# Patient Record
Sex: Male | Born: 1965 | Race: White | Hispanic: No | Marital: Married | Smoking: Never smoker
Health system: Southern US, Community
[De-identification: ages and names within clinical notes are randomized; demographics above are authoritative.]

## PROBLEM LIST (undated history)

## (undated) DIAGNOSIS — K838 Other specified diseases of biliary tract: Secondary | ICD-10-CM

## (undated) DIAGNOSIS — K9189 Other postprocedural complications and disorders of digestive system: Secondary | ICD-10-CM

## (undated) DIAGNOSIS — N400 Enlarged prostate without lower urinary tract symptoms: Secondary | ICD-10-CM

## (undated) DIAGNOSIS — Z8489 Family history of other specified conditions: Secondary | ICD-10-CM

## (undated) DIAGNOSIS — R3911 Hesitancy of micturition: Secondary | ICD-10-CM

---

## 2001-07-13 ENCOUNTER — Encounter: Payer: Self-pay | Admitting: Internal Medicine

## 2001-07-13 ENCOUNTER — Ambulatory Visit (HOSPITAL_COMMUNITY): Admission: RE | Admit: 2001-07-13 | Discharge: 2001-07-13 | Payer: Self-pay | Admitting: Internal Medicine

## 2001-09-21 ENCOUNTER — Encounter: Admission: RE | Admit: 2001-09-21 | Discharge: 2001-09-21 | Payer: Self-pay | Admitting: Gastroenterology

## 2001-09-21 ENCOUNTER — Encounter: Payer: Self-pay | Admitting: Gastroenterology

## 2009-06-20 DIAGNOSIS — K838 Other specified diseases of biliary tract: Secondary | ICD-10-CM

## 2009-06-20 HISTORY — PX: ERCP W/ METAL STENT PLACEMENT: SHX1521

## 2009-06-20 HISTORY — DX: Other specified diseases of biliary tract: K83.8

## 2010-03-04 HISTORY — PX: CHOLECYSTECTOMY: SHX55

## 2014-09-25 ENCOUNTER — Emergency Department (HOSPITAL_BASED_OUTPATIENT_CLINIC_OR_DEPARTMENT_OTHER)
Admission: EM | Admit: 2014-09-25 | Discharge: 2014-09-25 | Disposition: A | Discharge status: Home / Self Care | Payer: Worker's Compensation | Attending: Emergency Medicine | Admitting: Emergency Medicine

## 2014-09-25 ENCOUNTER — Encounter (HOSPITAL_BASED_OUTPATIENT_CLINIC_OR_DEPARTMENT_OTHER): Payer: Self-pay | Admitting: *Deleted

## 2014-09-25 DIAGNOSIS — K429 Umbilical hernia without obstruction or gangrene: Secondary | ICD-10-CM | POA: Insufficient documentation

## 2014-09-25 DIAGNOSIS — R1033 Periumbilical pain: Secondary | ICD-10-CM | POA: Diagnosis present

## 2014-09-25 NOTE — Discharge Instructions (Signed)
Hernia °A hernia happens when an organ inside your body pushes out through a weak spot in your belly (abdominal) wall. Most hernias get worse over time. They can often be pushed back into place (reduced). Surgery may be needed to repair hernias that cannot be pushed into place. °HOME CARE °· Keep doing normal activities. °· Avoid lifting more than 10 pounds (4.5 kilograms). °· Cough gently and avoid straining. Over time, these things will: °¨ Increase your hernia size. °¨ Irritate your hernia. °¨ Break down hernia repairs. °· Stop smoking. °· Do not wear anything tight over your hernia. Do not keep the hernia in with an outside bandage. °· Eat food that is high in fiber (fruit, vegetables, whole grains). °· Drink enough fluids to keep your pee (urine) clear or pale yellow. °· Take medicines to make your poop soft (stool softeners) if you cannot poop (constipated). °GET HELP RIGHT AWAY IF:  °· You have a fever. °· You have belly pain that gets worse. °· You feel sick to your stomach (nauseous) and throw up (vomit). °· Your skin starts to bulge out. °· Your hernia turns a different color, feels hard, or is tender. °· You have increased pain or puffiness (swelling) around the hernia. °· You poop more or less often. °· Your poop does not look the way normally does. °· You have watery poop (diarrhea). °· You cannot push the hernia back in place by applying gentle pressure while lying down. °MAKE SURE YOU:  °· Understand these instructions. °· Will watch your condition. °· Will get help right away if you are not doing well or get worse. °Document Released: 11/24/2009 Document Revised: 08/29/2011 Document Reviewed: 11/24/2009 °ExitCare® Patient Information ©2015 [employer]. This information is not intended to replace advice given to you by your health care provider. Make sure you discuss any questions you have with your health care provider. ° °

## 2014-09-25 NOTE — ED Notes (Signed)
Was picking up a 150 lb object and felt a pain in his abdomen. Possible umbellical hernia.

## 2014-09-25 NOTE — ED Provider Notes (Signed)
CSN: [id number]     Arrival date & time 09/25/14  1545 History   First MD Initiated Contact with Patient 09/25/14 1650     Chief Complaint  Patient presents with  . Abdominal Pain      HPI Patient was doing a simulated rescue and was lifting 150 pound object when he developed a protrusion in his umbilicus.  He pushed on it for a while and then it is gone now.  Patient has no significant pain.  No vomiting or diarrhea. History reviewed. No pertinent past medical history. Past Surgical History  Procedure Laterality Date  . Cholecystectomy     No family history on file. History  Substance Use Topics  . Smoking status: Never Smoker   . Smokeless tobacco: Not on file  . Alcohol Use: No    Review of Systems  All other systems reviewed and are negative  Allergies  Ciprofloxacin hcl  Home Medications   Prior to Admission medications   Not on File   BP 128/81 mmHg  Pulse 76  Temp(Src) 98.3 F (36.8 C) (Oral)  Resp 20  Ht 5\' 11"  (1.803 m)  Wt 185 lb (83.915 kg)  BMI 25.81 kg/m2  SpO2 100% Physical Exam  Constitutional: He is oriented to person, place, and time. He appears well-developed and well-nourished. No distress.  HENT:  Head: Normocephalic and atraumatic.  Eyes: Pupils are equal, round, and reactive to light.  Neck: Normal range of motion.  Cardiovascular: Normal rate and intact distal pulses.   Pulmonary/Chest: No respiratory distress.  Abdominal: Normal appearance. He exhibits no distension.    Musculoskeletal: Normal range of motion.  Neurological: He is alert and oriented to person, place, and time. No cranial nerve deficit.  Skin: Skin is warm and dry. No rash noted.  Psychiatric: He has a normal mood and affect. His behavior is normal.  Nursing note and vitals reviewed.   ED Course  Procedures (including critical care time) Labs Review Labs Reviewed - No data to display  Imaging Review No results found.    MDM   Final diagnoses:  Umbilical  hernia, recurrence not specified        Nelva Nayobert Auston Halfmann, MD 09/25/14 1723

## 2014-10-14 ENCOUNTER — Other Ambulatory Visit: Payer: Self-pay | Admitting: General Surgery

## 2014-12-19 ENCOUNTER — Encounter (HOSPITAL_COMMUNITY)
Admission: RE | Admit: 2014-12-19 | Discharge: 2014-12-19 | Disposition: A | Discharge status: Home / Self Care | Payer: Worker's Compensation | Source: Ambulatory Visit | Attending: General Surgery | Admitting: General Surgery

## 2014-12-19 ENCOUNTER — Encounter (HOSPITAL_COMMUNITY): Payer: Self-pay

## 2014-12-19 DIAGNOSIS — Z01812 Encounter for preprocedural laboratory examination: Secondary | ICD-10-CM | POA: Diagnosis not present

## 2014-12-19 DIAGNOSIS — K432 Incisional hernia without obstruction or gangrene: Secondary | ICD-10-CM | POA: Diagnosis not present

## 2014-12-19 HISTORY — DX: Family history of other specified conditions: Z84.89

## 2014-12-19 HISTORY — DX: Hesitancy of micturition: R39.11

## 2014-12-19 LAB — BASIC METABOLIC PANEL
ANION GAP: 7 (ref 5–15)
BUN: 9 mg/dL (ref 6–20)
CO2: 28 mmol/L (ref 22–32)
CREATININE: 1.21 mg/dL (ref 0.61–1.24)
Calcium: 9.1 mg/dL (ref 8.9–10.3)
Chloride: 103 mmol/L (ref 101–111)
GFR calc Af Amer: >60 mL/min (ref >60)
Glucose, Bld: 105 mg/dL — ABNORMAL HIGH (ref 65–99)
POTASSIUM: 4.5 mmol/L (ref 3.5–5.1)
Sodium: 138 mmol/L (ref 135–145)

## 2014-12-19 LAB — CBC WITH DIFFERENTIAL/PLATELET
Basophils Absolute: 0 10*3/uL (ref 0.0–0.1)
Basophils Relative: 1 % (ref 0–1)
EOS ABS: 0.1 10*3/uL (ref 0.0–0.7)
EOS PCT: 1 % (ref 0–5)
HCT: 45.6 % (ref 39.0–52.0)
Hemoglobin: 15.1 g/dL (ref 13.0–17.0)
LYMPHS PCT: 28 % (ref 12–46)
Lymphs Abs: 1.7 10*3/uL (ref 0.7–4.0)
MCH: 28 pg (ref 26.0–34.0)
MCHC: 33.1 g/dL (ref 30.0–36.0)
MCV: 84.4 fL (ref 78.0–100.0)
Monocytes Absolute: 0.5 10*3/uL (ref 0.1–1.0)
Monocytes Relative: 7 % (ref 3–12)
Neutro Abs: 4 10*3/uL (ref 1.7–7.7)
Neutrophils Relative %: 63 % (ref 43–77)
Platelets: 248 10*3/uL (ref 150–400)
RBC: 5.4 MIL/uL (ref 4.22–5.81)
RDW: 13 % (ref 11.5–15.5)
WBC: 6.2 10*3/uL (ref 4.0–10.5)

## 2014-12-19 LAB — NO BLOOD PRODUCTS

## 2014-12-19 NOTE — Patient Instructions (Signed)
Karenann Caiimothy R Nakatani  12/19/2014   Your procedure is scheduled on: Wednesday December 24, 2014   [facility]  Entrance take [facility]  elevators to 3rd floor to  Short Stay Center at 6:30 AM.  Call this number if you have problems the morning of surgery [phone]   Remember: ONLY 1 PERSON MAY GO WITH YOU TO SHORT STAY TO GET  READY MORNING OF YOUR SURGERY.  Do not eat food or drink liquids :After Midnight.     Take these medicines the morning of surgery with A SIP OF WATER: Loratadine (Claritin) if needed                                You may not have any metal on your body including hair pins and              piercings  Do not wear jewelry, lotions, powders colognes, deodorant                          Men may shave face and neck.   Do not bring valuables to the hospital. [facility] IS NOT             RESPONSIBLE   FOR VALUABLES.  Contacts, dentures or bridgework may not be worn into surgery.  Leave suitcase in the car. After surgery it may be brought to your room.     _____________________________________________________________________             [facility] - Preparing for [facility] Before surgery, you can play an important role.  Because skin is not sterile, your skin needs to be as free of germs as possible.  You can reduce the number of germs on your skin by washing with CHG (chlorahexidine gluconate) soap before surgery.  CHG is an antiseptic cleaner which kills germs and bonds with the skin to continue killing germs even after washing. Please DO NOT use if you have an allergy to CHG or antibacterial soaps.  If your skin becomes reddened/irritated stop using the CHG and inform your nurse when you arrive at Short Stay. Do not shave (including legs and underarms) for at least 48 hours prior to the first CHG shower.  You may shave your face/neck. Please follow these instructions carefully:  1.  Shower with CHG Soap the night before surgery and the   morning of Surgery.  2.  If you choose to wash your hair, wash your hair first as usual with your  normal  shampoo.  3.  After you shampoo, rinse your hair and body thoroughly to remove the  shampoo.                           4.  Use CHG as you would any other liquid soap.  You can apply chg directly  to the skin and wash                       Gently with a scrungie or clean washcloth.  5.  Apply the CHG Soap to your body ONLY FROM THE NECK DOWN.   Do not use on face/ open  Wound or open sores. Avoid contact with eyes, ears mouth and genitals (private parts).                       Wash face,  Genitals (private parts) with your normal soap.             6.  Wash thoroughly, paying special attention to the area where your surgery  will be performed.  7.  Thoroughly rinse your body with warm water from the neck down.  8.  DO NOT shower/wash with your normal soap after using and rinsing off  the CHG Soap.                9.  Pat yourself dry with a clean towel.            10.  Wear clean pajamas.            11.  Place clean sheets on your bed the night of your first shower and do not  sleep with pets. Day of Surgery : Do not apply any lotions/deodorants the morning of surgery.  Please wear clean clothes to the hospital/surgery center.  FAILURE TO FOLLOW THESE INSTRUCTIONS MAY RESULT IN THE CANCELLATION OF YOUR SURGERY PATIENT SIGNATURE_________________________________  NURSE SIGNATURE__________________________________  ________________________________________________________________________

## 2014-12-19 NOTE — Progress Notes (Signed)
Blood Refusal Form faxed to [facility]  Contacted Dr Aura CampsIngrams office to notify of blood refusal but noting that pt agrees to albumin or albumin containing products spoke with Toniann FailWendy

## 2014-12-22 NOTE — H&P (Signed)
Harry Warren  Location: [facility] Patient #: [id number] DOB: 1966-01-02 Married / Language: English / Race: White Male        History of Present Illness   The patient is a 49 year old male who presents with an incisional hernia. This is a very pleasant 49 year old gentleman, here with his wife to talk about a periumbilical incisional hernia. He was evaluated by Nelva Nay in the ED.         . The patient has a history of a laparoscopic cholecystectomy in [facility] 4 years ago by Dr. Loreta Ave. He went home the same day but came back a day or 2 later with a cystic duct stump leak, bile leak. He had an ERCP with a stent and recovered but required a week of hospitalization. His wife said she used to do research with Dr. Loreta Ave. H       e has been doing well. He's recently had 3 or 4 episodes of a protrusion and pain at his umbilicus. This started when he was doing a simulated rescue and lifting a heavy object. He noticed a painful bulge. He pushed on it for a while and it went back in. Denies nausea vomiting or constipation or diarrhea. He is a healthy gentleman. Cholecystectomy is the only surgery he's had.         They are Jehov[employer]'s [employer]es. 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 He states that he wants to   have this repaired. I told him that I was going to treat this like an incisional hernia. We talked about open repair and laparoscopic repair. It was his preference to do the operation with the best durability and I felt that a laparoscopic repair with a larger piece of mesh would be the way to go, assuming we did not run into bad adhesions. He will be scheduled for a laparoscopic repair of incisional hernia with mesh, possible open repair. He is aware of the indications, details, techniques, and numerous risks of the surgery. He is aware the risk of bleeding, infection, conversion to open laparotomy, injury to adjacent organs such as  the intestine was major reconstructive surgery, mesh infection, recurrence of the hernia, and other unforeseen problems. He knows that he will be hospitalized for a couple of days in any event and that there will be no sports or heavy lifting for about 6 weeks. At this time all of his questions were answered. He agrees with this plan.   Other Problems  No pertinent past medical history  Past Surgical History  Gallbladder Surgery - Laparoscopic  Diagnostic Studies History  Colonoscopy never  Allergies  Ciprofloxacin *FLUOROQUINOLONES*  Medication History  No Current Medications  Social History  Alcohol use Occasional alcohol use. Caffeine use Coffee. No drug use Tobacco use Never smoker.  Review of Systems  General Not Present- Appetite Loss, Chills, Fatigue, Fever, Night Sweats, Weight Gain and Weight Loss. Skin Not Present- Change in Wart/Mole, Dryness, Hives, Jaundice, New Lesions, Non-Healing Wounds, Rash and Ulcer. HEENT Not Present- Earache, Hearing Loss, Hoarseness, Nose Bleed, Oral Ulcers, Ringing in the Ears, Seasonal Allergies, Sinus Pain, Sore Throat, Visual Disturbances, Wears glasses/contact lenses and Yellow Eyes. Respiratory Not Present- Bloody sputum, Chronic Cough, Difficulty Breathing, Snoring and Wheezing. Breast Not Present- Breast Mass, Breast Pain, Nipple Discharge and Skin Changes. Cardiovascular Not Present- Chest Pain, Difficulty Breathing Lying Down, Leg Cramps, Palpitations, Rapid Heart Rate, Shortness of Breath and Swelling of Extremities. Gastrointestinal Not Present-  Abdominal Pain, Bloating, Bloody Stool, Change in Bowel Habits, Chronic diarrhea, Constipation, Difficulty Swallowing, Excessive gas, Gets full quickly at meals, Hemorrhoids, Indigestion, Nausea, Rectal Pain and Vomiting. Male Genitourinary Not Present- Blood in Urine, Change in Urinary Stream, Frequency, Impotence, Nocturia, Painful Urination, Urgency and Urine  Leakage.   Vitals   Weight: 194.2 lb Height: 71in Body Surface Area: 2.1 m Body Mass Index: 27.09 kg/m Temp.: 98.49F(Oral)  Pulse: 72 (Regular)  BP: 104/66 (Sitting, Left Arm, Standard)    Physical Exam  General Mental Status-Alert. General Appearance-Consistent with stated age. Hydration-Well hydrated. Voice-Normal.  Head and Neck Head-normocephalic, atraumatic with no lesions or palpable masses. Trachea-midline. Thyroid Gland Characteristics - normal size and consistency.  Eye Eyeball - Bilateral-Extraocular movements intact. Sclera/Conjunctiva - Bilateral-No scleral icterus.  Chest and Lung Exam Chest and lung exam reveals -quiet, even and easy respiratory effort with no use of accessory muscles and on auscultation, normal breath sounds, no adventitious sounds and normal vocal resonance. Inspection Chest Wall - Normal. Back - normal.  Cardiovascular Cardiovascular examination reveals -normal heart sounds, regular rate and rhythm with no murmurs and normal pedal pulses bilaterally.  Abdomen Palpation/Percussion Palpation and Percussion of the abdomen reveal - Soft, Non Tender, No Rebound tenderness, No Rigidity (guarding) and No hepatosplenomegaly. Auscultation Auscultation of the abdomen reveals - Bowel sounds normal. Note: There are laparoscopic scars from his cholecystectomy. There is a short transverse scar just superior to the umbilicus. There is a hernia protrusion at the umbilicus which is reducible. Defect less than 2 cm, by exam. No infection. Slightly tender. No inguinal hernias.   Neurologic Neurologic evaluation reveals -alert and oriented x 3 with no impairment of recent or remote memory. Mental Status-Normal.  Musculoskeletal Normal Exam - Left-Upper Extremity Strength Normal and Lower Extremity Strength Normal. Normal Exam - Right-Upper Extremity Strength Normal and Lower Extremity Strength  Normal.  Lymphatic Head & Neck  General Head & Neck Lymphatics: Bilateral - Description - Normal. Axillary  General Axillary Region: Bilateral - Description - Normal. Tenderness - Non Tender. Femoral & Inguinal  Generalized Femoral & Inguinal Lymphatics: Bilateral - Description - Normal. Tenderness - Non Tender.    Assessment & Plan INCISIONAL HERNIA, WITHOUT OBSTRUCTION OR GANGRENE (553.21  K43.2)   Schedule for Surgery You have a hernia in the midline at the umbilicus. You also have an incision just above the umbilicus. I don't know whether this is an incisional hernia or not but most likely it is. The most durable approach is to repair this laparoscopically. That way I can put a larger piece of mesh in. This will require a 1 or 2 night hospitalization. We talked about the techniques and risks of this surgery in detail. We will schedule this surgery for you at [facility] at your convenience.  LAPAROSCOPIC REPAIR, INCISIONAL HERNIA ([id number])  HISTORY OF LAPAROSCOPIC CHOLECYSTECTOMY (V45.89  Z90.49) Impression: Complicated by post op cystic duct stump leak. Required ERCP and one-week hospitalization. Recovered.  PATIENT IS JEHOVAH'S WITNESS (V49.89  Z78.9) Refuses blood transfusioins    Angelia MouldHaywood M. Derrell LollingIngram, M.D., [facility] General and Minimally invasive Surgery Breast and Colorectal Surgery Office:   [phone] Pager:   [phone]

## 2014-12-24 ENCOUNTER — Inpatient Hospital Stay (HOSPITAL_COMMUNITY)
Admission: RE | Admit: 2014-12-24 | Discharge: 2014-12-28 | DRG: 355 | Disposition: A | Discharge status: Home / Self Care | Payer: Worker's Compensation | Source: Ambulatory Visit | Attending: General Surgery | Admitting: General Surgery

## 2014-12-24 ENCOUNTER — Ambulatory Visit (HOSPITAL_COMMUNITY): Payer: Worker's Compensation | Admitting: Registered Nurse

## 2014-12-24 ENCOUNTER — Encounter (HOSPITAL_COMMUNITY): Payer: Self-pay | Admitting: *Deleted

## 2014-12-24 ENCOUNTER — Encounter (HOSPITAL_COMMUNITY)
Admission: RE | Disposition: A | Discharge status: Home / Self Care | Payer: Self-pay | Source: Ambulatory Visit | Attending: General Surgery

## 2014-12-24 DIAGNOSIS — K432 Incisional hernia without obstruction or gangrene: Principal | ICD-10-CM | POA: Diagnosis present

## 2014-12-24 DIAGNOSIS — R338 Other retention of urine: Secondary | ICD-10-CM

## 2014-12-24 DIAGNOSIS — R339 Retention of urine, unspecified: Secondary | ICD-10-CM | POA: Diagnosis present

## 2014-12-24 DIAGNOSIS — N400 Enlarged prostate without lower urinary tract symptoms: Secondary | ICD-10-CM

## 2014-12-24 DIAGNOSIS — Z9049 Acquired absence of other specified parts of digestive tract: Secondary | ICD-10-CM | POA: Diagnosis present

## 2014-12-24 HISTORY — DX: Benign prostatic hyperplasia without lower urinary tract symptoms: N40.0

## 2014-12-24 HISTORY — PX: INCISIONAL HERNIA REPAIR: SHX193

## 2014-12-24 HISTORY — DX: Other postprocedural complications and disorders of digestive system: K91.89

## 2014-12-24 HISTORY — PX: INSERTION OF MESH: SHX5868

## 2014-12-24 HISTORY — DX: Other specified diseases of biliary tract: K83.8

## 2014-12-24 LAB — NO BLOOD PRODUCTS

## 2014-12-24 SURGERY — REPAIR, HERNIA, INCISIONAL, LAPAROSCOPIC
Anesthesia: General

## 2014-12-24 MED ORDER — OXYCODONE-ACETAMINOPHEN 5-325 MG PO TABS: 1.0000 | ORAL_TABLET | ORAL | Status: DC | PRN

## 2014-12-24 MED ORDER — ROCURONIUM BROMIDE 100 MG/10ML IV SOLN
INTRAVENOUS | Status: AC
  Filled 2014-12-24: qty 1

## 2014-12-24 MED ORDER — GLYCOPYRROLATE 0.2 MG/ML IJ SOLN
INTRAMUSCULAR | Status: DC | PRN
  Administered 2014-12-24: 0.4 mg via INTRAVENOUS

## 2014-12-24 MED ORDER — ONDANSETRON HCL 4 MG/2ML IJ SOLN
INTRAMUSCULAR | Status: AC
  Filled 2014-12-24: qty 2

## 2014-12-24 MED ORDER — CEFAZOLIN SODIUM-DEXTROSE 2-3 GM-% IV SOLR
2.0000 g | INTRAVENOUS | Status: AC
  Administered 2014-12-24: 2 g via INTRAVENOUS

## 2014-12-24 MED ORDER — HYDROMORPHONE HCL 1 MG/ML IJ SOLN: 0.2500 mg | INTRAMUSCULAR | Status: DC | PRN

## 2014-12-24 MED ORDER — LACTATED RINGERS IR SOLN
Status: DC | PRN
Start: 2014-12-24 — End: 2014-12-24
  Administered 2014-12-24: 1000 mL

## 2014-12-24 MED ORDER — MIDAZOLAM HCL 2 MG/2ML IJ SOLN
INTRAMUSCULAR | Status: AC
  Filled 2014-12-24: qty 2

## 2014-12-24 MED ORDER — LACTATED RINGERS IV SOLN
INTRAVENOUS | Status: DC | PRN
  Administered 2014-12-24 (×2): via INTRAVENOUS

## 2014-12-24 MED ORDER — ONDANSETRON HCL 4 MG PO TABS: 4.0000 mg | ORAL_TABLET | Freq: Four times a day (QID) | ORAL | Status: DC | PRN

## 2014-12-24 MED ORDER — FENTANYL CITRATE (PF) 100 MCG/2ML IJ SOLN
INTRAMUSCULAR | Status: DC | PRN
  Administered 2014-12-24: 100 ug via INTRAVENOUS
  Administered 2014-12-24: 150 ug via INTRAVENOUS
  Administered 2014-12-24: 100 ug via INTRAVENOUS
  Administered 2014-12-24 (×2): 50 ug via INTRAVENOUS

## 2014-12-24 MED ORDER — METHOCARBAMOL 1000 MG/10ML IJ SOLN
500.0000 mg | Freq: Four times a day (QID) | INTRAVENOUS | Status: DC | PRN
  Administered 2014-12-24 – 2014-12-27 (×7): 500 mg via INTRAVENOUS
  Filled 2014-12-24 (×14): qty 5

## 2014-12-24 MED ORDER — HYDROMORPHONE HCL 2 MG/ML IJ SOLN
INTRAMUSCULAR | Status: AC
  Filled 2014-12-24: qty 1

## 2014-12-24 MED ORDER — POTASSIUM CHLORIDE IN NACL 20-0.9 MEQ/L-% IV SOLN
INTRAVENOUS | Status: DC
  Administered 2014-12-24 – 2014-12-27 (×7): via INTRAVENOUS
  Administered 2014-12-28: 100 mL/h via INTRAVENOUS
  Filled 2014-12-24 (×11): qty 1000

## 2014-12-24 MED ORDER — GLYCOPYRROLATE 0.2 MG/ML IJ SOLN
INTRAMUSCULAR | Status: AC
  Filled 2014-12-24: qty 3

## 2014-12-24 MED ORDER — PROPOFOL 10 MG/ML IV BOLUS
INTRAVENOUS | Status: AC
  Filled 2014-12-24: qty 20

## 2014-12-24 MED ORDER — LIDOCAINE HCL (CARDIAC) 20 MG/ML IV SOLN
INTRAVENOUS | Status: AC
  Filled 2014-12-24: qty 5

## 2014-12-24 MED ORDER — NEOSTIGMINE METHYLSULFATE 10 MG/10ML IV SOLN
INTRAVENOUS | Status: DC | PRN
  Administered 2014-12-24: 3 mg via INTRAVENOUS

## 2014-12-24 MED ORDER — DEXAMETHASONE SODIUM PHOSPHATE 10 MG/ML IJ SOLN
INTRAMUSCULAR | Status: AC
  Filled 2014-12-24: qty 1

## 2014-12-24 MED ORDER — ONDANSETRON HCL 4 MG/2ML IJ SOLN
INTRAMUSCULAR | Status: DC | PRN
  Administered 2014-12-24: 4 mg via INTRAVENOUS

## 2014-12-24 MED ORDER — NEOSTIGMINE METHYLSULFATE 10 MG/10ML IV SOLN
INTRAVENOUS | Status: AC
  Filled 2014-12-24: qty 1

## 2014-12-24 MED ORDER — PROPOFOL 10 MG/ML IV BOLUS
INTRAVENOUS | Status: DC | PRN
  Administered 2014-12-24: 50 mg via INTRAVENOUS
  Administered 2014-12-24: 200 mg via INTRAVENOUS

## 2014-12-24 MED ORDER — HYDROMORPHONE HCL 1 MG/ML IJ SOLN
INTRAMUSCULAR | Status: DC | PRN
  Administered 2014-12-24 (×2): 1 mg via INTRAVENOUS

## 2014-12-24 MED ORDER — FENTANYL CITRATE (PF) 250 MCG/5ML IJ SOLN
INTRAMUSCULAR | Status: AC
  Filled 2014-12-24: qty 5

## 2014-12-24 MED ORDER — ONDANSETRON HCL 4 MG/2ML IJ SOLN
4.0000 mg | Freq: Four times a day (QID) | INTRAMUSCULAR | Status: DC | PRN
  Administered 2014-12-25 – 2014-12-26 (×3): 4 mg via INTRAVENOUS
  Filled 2014-12-24 (×4): qty 2

## 2014-12-24 MED ORDER — CEFAZOLIN SODIUM-DEXTROSE 2-3 GM-% IV SOLR
2.0000 g | Freq: Four times a day (QID) | INTRAVENOUS | Status: AC
  Administered 2014-12-24 – 2014-12-25 (×3): 2 g via INTRAVENOUS
  Filled 2014-12-24 (×3): qty 50

## 2014-12-24 MED ORDER — CHLORHEXIDINE GLUCONATE 4 % EX LIQD: 1.0000 "application " | Freq: Once | CUTANEOUS | Status: DC

## 2014-12-24 MED ORDER — CEFAZOLIN SODIUM-DEXTROSE 2-3 GM-% IV SOLR
INTRAVENOUS | Status: AC
  Filled 2014-12-24: qty 50

## 2014-12-24 MED ORDER — LIDOCAINE HCL (CARDIAC) 20 MG/ML IV SOLN
INTRAVENOUS | Status: DC | PRN
  Administered 2014-12-24: 75 mg via INTRAVENOUS
  Administered 2014-12-24: 25 mg via INTRATRACHEAL

## 2014-12-24 MED ORDER — BUPIVACAINE-EPINEPHRINE 0.5% -1:200000 IJ SOLN
INTRAMUSCULAR | Status: DC | PRN
  Administered 2014-12-24: 18 mL

## 2014-12-24 MED ORDER — ROCURONIUM BROMIDE 100 MG/10ML IV SOLN
INTRAVENOUS | Status: DC | PRN
  Administered 2014-12-24: 35 mg via INTRAVENOUS

## 2014-12-24 MED ORDER — ENOXAPARIN SODIUM 40 MG/0.4ML ~~LOC~~ SOLN
40.0000 mg | SUBCUTANEOUS | Status: DC
  Administered 2014-12-25 – 2014-12-27 (×3): 40 mg via SUBCUTANEOUS
  Filled 2014-12-24 (×5): qty 0.4

## 2014-12-24 MED ORDER — MIDAZOLAM HCL 5 MG/5ML IJ SOLN
INTRAMUSCULAR | Status: DC | PRN
  Administered 2014-12-24: 2 mg via INTRAVENOUS

## 2014-12-24 MED ORDER — BUPIVACAINE-EPINEPHRINE (PF) 0.5% -1:200000 IJ SOLN
INTRAMUSCULAR | Status: AC
  Filled 2014-12-24: qty 30

## 2014-12-24 MED ORDER — DEXAMETHASONE SODIUM PHOSPHATE 10 MG/ML IJ SOLN
INTRAMUSCULAR | Status: DC | PRN
  Administered 2014-12-24: 10 mg via INTRAVENOUS

## 2014-12-24 MED ORDER — HYDROMORPHONE HCL 1 MG/ML IJ SOLN
1.0000 mg | INTRAMUSCULAR | Status: DC | PRN
  Administered 2014-12-24 – 2014-12-26 (×12): 1 mg via INTRAVENOUS
  Filled 2014-12-24 (×12): qty 1

## 2014-12-24 SURGICAL SUPPLY — 45 items
ADH SKN CLS APL DERMABOND .7 (GAUZE/BANDAGES/DRESSINGS) ×1
APL SKNCLS STERI-STRIP NONHPOA (GAUZE/BANDAGES/DRESSINGS)
APPLIER CLIP 5 13 M/L LIGAMAX5 (MISCELLANEOUS)
APR CLP MED LRG 5 ANG JAW (MISCELLANEOUS)
BENZOIN TINCTURE PRP APPL 2/3 (GAUZE/BANDAGES/DRESSINGS) IMPLANT
BINDER ABDOMINAL 12 ML 46-62 (SOFTGOODS) ×3 IMPLANT
CABLE HIGH FREQUENCY MONO STRZ (ELECTRODE) ×2 IMPLANT
CHLORAPREP W/TINT 26ML (MISCELLANEOUS) ×3 IMPLANT
CLIP APPLIE 5 13 M/L LIGAMAX5 (MISCELLANEOUS) IMPLANT
CLOSURE WOUND 1/2 X4 (GAUZE/BANDAGES/DRESSINGS)
DECANTER SPIKE VIAL GLASS SM (MISCELLANEOUS) ×1 IMPLANT
DERMABOND ADVANCED (GAUZE/BANDAGES/DRESSINGS) ×2
DERMABOND ADVANCED .7 DNX12 (GAUZE/BANDAGES/DRESSINGS) IMPLANT
DEVICE SECURE STRAP 25 ABSORB (INSTRUMENTS) ×6 IMPLANT
DEVICE TROCAR PUNCTURE CLOSURE (ENDOMECHANICALS) ×3 IMPLANT
DISSECTOR BLUNT TIP ENDO 5MM (MISCELLANEOUS) IMPLANT
DRAPE LAPAROSCOPIC ABDOMINAL (DRAPES) ×3 IMPLANT
DRAPE POUCH INSTRU U-SHP 10X18 (DRAPES) ×3 IMPLANT
ELECT REM PT RETURN 9FT ADLT (ELECTROSURGICAL) ×3
ELECTRODE REM PT RTRN 9FT ADLT (ELECTROSURGICAL) ×1 IMPLANT
GLOVE EUDERMIC 7 POWDERFREE (GLOVE) ×11 IMPLANT
GOWN STRL REUS W/TWL XL LVL3 (GOWN DISPOSABLE) ×11 IMPLANT
KIT BASIN OR (CUSTOM PROCEDURE TRAY) ×3 IMPLANT
MARKER SKIN DUAL TIP RULER LAB (MISCELLANEOUS) ×3 IMPLANT
MESH VENTRALIGHT ST 6IN CRC (Mesh General) ×2 IMPLANT
NDL SPNL 22GX3.5 QUINCKE BK (NEEDLE) IMPLANT
NEEDLE SPNL 22GX3.5 QUINCKE BK (NEEDLE) IMPLANT
SCISSORS LAP 5X35 DISP (ENDOMECHANICALS) ×3 IMPLANT
SET IRRIG TUBING LAPAROSCOPIC (IRRIGATION / IRRIGATOR) ×2 IMPLANT
SHEARS HARMONIC ACE PLUS 36CM (ENDOMECHANICALS) IMPLANT
SLEEVE XCEL OPT CAN 5 100 (ENDOMECHANICALS) ×2 IMPLANT
SOLUTION ANTI FOG 6CC (MISCELLANEOUS) ×3 IMPLANT
STAPLER VISISTAT 35W (STAPLE) IMPLANT
STRIP CLOSURE SKIN 1/2X4 (GAUZE/BANDAGES/DRESSINGS) IMPLANT
SUT MNCRL AB 4-0 PS2 18 (SUTURE) ×4 IMPLANT
SUT NOVA 0 T19/GS 22DT (SUTURE) IMPLANT
SUT NOVA NAB DX-16 0-1 5-0 T12 (SUTURE) ×2 IMPLANT
TOWEL OR 17X26 10 PK STRL BLUE (TOWEL DISPOSABLE) ×3 IMPLANT
TOWEL OR NON WOVEN STRL DISP B (DISPOSABLE) ×3 IMPLANT
TRAY FOLEY W/METER SILVER 14FR (SET/KITS/TRAYS/PACK) ×3 IMPLANT
TRAY LAPAROSCOPIC (CUSTOM PROCEDURE TRAY) ×3 IMPLANT
TROCAR BLADELESS OPT 5 100 (ENDOMECHANICALS) ×3 IMPLANT
TROCAR XCEL BLUNT TIP 100MML (ENDOMECHANICALS) IMPLANT
TROCAR XCEL NON-BLD 11X100MML (ENDOMECHANICALS) ×2 IMPLANT
TUBING INSUFFLATION 10FT LAP (TUBING) ×3 IMPLANT

## 2014-12-24 NOTE — Transfer of Care (Signed)
Immediate Anesthesia Transfer of Care Note  Patient: Harry Warren  Procedure(s) Performed: Procedure(s): LAPAROSCOPIC POSSIBLE OPEN  INCISIONAL HERNIA REPAIR (N/A) INSERTION OF MESH (N/A)  Patient Location: PACU  Anesthesia Type:General  Level of Consciousness: awake, alert , oriented and patient cooperative  Airway & Oxygen Therapy: Patient Spontanous Breathing and Patient connected to face mask oxygen  Post-op Assessment: Report given to RN, Post -op Vital signs reviewed and stable and Patient moving all extremities X 4  Post vital signs: stable  Last Vitals:  Filed Vitals:   12/24/14 1023  BP: 131/71  Pulse:   Temp:   Resp:     Complications: No apparent anesthesia complications

## 2014-12-24 NOTE — Interval H&P Note (Signed)
History and Physical Interval Note:  12/24/2014 7:51 AM  Harry Warren  has presented today for surgery, with the diagnosis of incisional hernia  The various methods of treatment have been discussed with the patient and family. After consideration of risks, benefits and other options for treatment, the patient has consented to  Procedure(s): LAPAROSCOPIC POSSIBLE OPEN  INCISIONAL HERNIA REPAIR (N/A) INSERTION OF MESH (N/A) as a surgical intervention .  The patient's history has been reviewed, patient examined, no change in status, stable for surgery.  I have reviewed the patient's chart and labs.  Questions were answered to the patient's satisfaction.     Ernestene MentionINGRAM,Virna Livengood M

## 2014-12-24 NOTE — Anesthesia Preprocedure Evaluation (Signed)
Anesthesia Evaluation  Patient identified by MRN, date of birth, ID band Patient awake    Reviewed: Allergy & Precautions, NPO status , Patient's Chart, lab work & pertinent test results  History of Anesthesia Complications (+) Family history of anesthesia reaction and history of anesthetic complications  Airway Mallampati: II  TM Distance: >3 FB Neck ROM: Full    Dental no notable dental hx.    Pulmonary neg pulmonary ROS,  breath sounds clear to auscultation  Pulmonary exam normal       Cardiovascular Exercise Tolerance: Good negative cardio ROS Normal cardiovascular examRhythm:Regular Rate:Normal     Neuro/Psych negative neurological ROS  negative psych ROS   GI/Hepatic negative GI ROS, Neg liver ROS,   Endo/Other  negative endocrine ROS  Renal/GU negative Renal ROS  negative genitourinary   Musculoskeletal negative musculoskeletal ROS (+)   Abdominal   Peds negative pediatric ROS (+)  Hematology negative hematology ROS (+)   Anesthesia Other Findings   Reproductive/Obstetrics negative OB ROS                             Anesthesia Physical Anesthesia Plan  ASA: I  Anesthesia Plan: General   Post-op Pain Management:    Induction: Intravenous  Airway Management Planned: Oral ETT  Additional Equipment:   Intra-op Plan:   Post-operative Plan: Extubation in OR  Informed Consent: I have reviewed the patients History and Physical, chart, labs and discussed the procedure including the risks, benefits and alternatives for the proposed anesthesia with the patient or authorized representative who has indicated his/her understanding and acceptance.   Dental advisory given  Plan Discussed with: CRNA  Anesthesia Plan Comments:         Anesthesia Quick Evaluation

## 2014-12-24 NOTE — Op Note (Signed)
Patient Name:           Harry Warren   Date of Surgery:        12/24/2014  Pre op Diagnosis:      Incisional hernia  Post op Diagnosis:    Incisional hernia  Procedure:                 Laparoscopic repair of incisional hernia with 15 cm diameter ventraLite - ST mesh inlay  Surgeon:                     Angelia Mould. Derrell Lolling, M.D., FACS  Assistant:                      OR staff  Operative Indications:  This is a very pleasant 49 year old gentleman, here with his wife to talk about a periumbilical incisional hernia. He was evaluated by Nelva Nay in the ED.   . The patient has a history of a laparoscopic cholecystectomy in [place] 4 years ago.He went home the same day but came back a day or 2 later with a cystic duct stump leak, bile leak. He had an ERCP with a stent and recovered but required a week of hospitalization.   He has been doing well. He's recently had 3 or 4 episodes of a protrusion and pain at his umbilicus. This started when he was doing a simulated rescue and lifting a heavy object. He noticed a painful bulge. He pushed on it for a while and it went back in. Denies nausea vomiting or constipation or diarrhea. He is a healthy gentleman. Cholecystectomy is the only surgery he's had. Examination reveals a small hernia at the umbilicus and a transverse incision at the superior rim of the umbilicus.  The defect is about 2 cm in size and reducible.  They are Jehovah's Witnesses. 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 He states that he wants to have this repaired. I told him that I was going to treat this like an incisional hernia. We talked about open repair and laparoscopic repair. It was his preference to do the operation with the best durability and I felt that a laparoscopic repair with a larger piece of mesh would be the way to go, assuming we did not run into bad adhesions. Marland Kitchen  Operative Findings:       There were  minimal adhesions to the anterior abdominal wall.  The hernia defect was about 2-2.5 cm in diameter.  I used a 15 cm diameter circular piece of ventral light ST mesh.  There were no abnormalities of the abdominal viscera on gross exam.  Procedure in Detail:          Following the induction of general endotracheal anesthesia the patient's abdomen was prepped and draped in a sterile fashion.  Intravenous antibiotic given.  Surgical timeout was performed.  0.5% Marcaine with epinephrine was used as local infiltration anesthetic.     A 5 mm optical trocar was placed in the left subcostal region without difficulty and pneumoperitoneum was created.  An 11 mm trochar was placed in the left flank and a 5 mm trocar placed in the left lower quadrant.  Minimal adhesions were taken down.  Using a spinal needle I measured the dimensions of the defect.  Using a 15 cm diameter piece of mesh I found that I could have 6-7 cm overlap in all directions.  The mesh was brought  to the operative field.  I drew a template on the abdominal wall for 4 equidistant suture fixation sites.  #1 Novafil was placed at the edge of the mesh at the 4 equidistant suture fixation sites.  The mesh was moistened and inserted into the abdominal cavity and positioned.  Great care was taken to position the rough side up toward the abdominal wall and the smooth side down toward the viscera.  Small incision was made at each of the 4 suture fixation sites.  Endo Close device was used to pass the sutures through the abdominal wall, being careful to take about a 1 cm bite of fascia.  After all the sutures were placed I lifted up the mesh and found the mesh was well-positioned with almost no redundancy.  I tied  the suture fixation sutures and the knots retracted within the wounds.  The mesh was further secured with the secure strap device in a double crown technique.  The outer rim was placed so as to have no more than 1 cm between each firing of the secure  strap device and the inner crown was then placed.  The mesh was examined repeatedly I found no defects.  There was no bleeding.  Pneumoperitoneum was released.  Trochars were removed.  Skin incisions were closed with subcuticular 4-0 Monocryl and Dermabond.  Abdominal binder was placed and patient taken to PACU in stable condition.  EBL 15 mL.  Counts correct.  Complications none.    Angelia MouldHaywood M. Derrell LollingIngram, M.D., FACS General and Minimally Invasive Surgery Breast and Colorectal Surgery  12/24/2014 10:14 AM

## 2014-12-24 NOTE — Anesthesia Postprocedure Evaluation (Signed)
  Anesthesia Post-op Note  Patient: Harry Warren  Procedure(s) Performed: Procedure(s) (LRB): LAPAROSCOPIC POSSIBLE OPEN  INCISIONAL HERNIA REPAIR (N/A) INSERTION OF MESH (N/A)  Patient Location: PACU  Anesthesia Type: General  Level of Consciousness: awake and alert   Airway and Oxygen Therapy: Patient Spontanous Breathing  Post-op Pain: mild  Post-op Assessment: Post-op Vital signs reviewed, Patient's Cardiovascular Status Stable, Respiratory Function Stable, Patent Airway and No signs of Nausea or vomiting  Last Vitals:  Filed Vitals:   12/24/14 1115  BP: 123/73  Pulse: 65  Temp: 36.7 C  Resp: 12    Post-op Vital Signs: stable   Complications: No apparent anesthesia complications

## 2014-12-24 NOTE — Anesthesia Procedure Notes (Signed)
Procedure Name: Intubation Date/Time: 12/24/2014 8:58 AM Performed by: Illene SilverEVANS, Otis Portal E Pre-anesthesia Checklist: Patient identified, Emergency Drugs available, Suction available and Patient being monitored Patient Re-evaluated:Patient Re-evaluated prior to inductionOxygen Delivery Method: Circle System Utilized Preoxygenation: Pre-oxygenation with 100% oxygen Intubation Type: IV induction Ventilation: Mask ventilation without difficulty Laryngoscope Size: Mac and 4 Grade View: Grade I Tube type: Oral Tube size: 7.5 mm Number of attempts: 1 Airway Equipment and Method: Stylet and Oral airway Placement Confirmation: ETT inserted through vocal cords under direct vision,  positive ETCO2 and breath sounds checked- equal and bilateral Secured at: 21 cm Tube secured with: Tape Dental Injury: Teeth and Oropharynx as per pre-operative assessment

## 2014-12-25 ENCOUNTER — Encounter (HOSPITAL_COMMUNITY): Payer: Self-pay | Admitting: General Surgery

## 2014-12-25 MED ORDER — HYDROCODONE-ACETAMINOPHEN 5-325 MG PO TABS
1.0000 | ORAL_TABLET | ORAL | Status: DC | PRN
  Administered 2014-12-25: 1 via ORAL
  Administered 2014-12-26: 2 via ORAL
  Filled 2014-12-25: qty 2
  Filled 2014-12-25: qty 1
  Filled 2014-12-25 (×2): qty 2

## 2014-12-25 MED ORDER — TAMSULOSIN HCL 0.4 MG PO CAPS
0.4000 mg | ORAL_CAPSULE | Freq: Every day | ORAL | Status: DC
  Administered 2014-12-25 – 2014-12-28 (×4): 0.4 mg via ORAL
  Filled 2014-12-25 (×5): qty 1

## 2014-12-25 NOTE — Progress Notes (Signed)
1 Day Post-Op  Subjective: Stable and alert. Anorexic, but not nauseated Unable to void, has required in and out cath twice.  Output normal.  Usually no problems with voiding but has been told he has a large prostate. Operative procedure and findings discussed with patient.  Objective: Vital signs in last 24 hours: Temp:  [97.8 F (36.6 C)-98.4 F (36.9 C)] 98.4 F (36.9 C) (07/07 0552) Pulse Rate:  [60-79] 66 (07/07 0552) Resp:  [6-18] 16 (07/07 0552) BP: (98-131)/(54-94) 98/54 mmHg (07/07 0552) SpO2:  [95 %-100 %] 99 % (07/07 0552) Weight:  [88.7 kg (195 lb 8.8 oz)] 88.7 kg (195 lb 8.8 oz) (07/06 1211)    Intake/Output from previous day: 07/06 0701 - 07/07 0700 In: 3581.7 [I.V.:3581.7] Out: 900 [Urine:900] Intake/Output this shift:    General appearance: Alert.  Minimal distress.  Cooperative.  Mental status normal. Resp: clear to auscultation bilaterally GI: Soft.  Appropriately tender diffusely.  Wounds look fine.  Minimal bowel sounds.  Lab Results:  No results found for this or any previous visit (from the past 24 hour(s)).   Studies/Results: No results found.  . enoxaparin (LOVENOX) injection  40 mg Subcutaneous Q24H  . tamsulosin  0.4 mg Oral QPC breakfast     Assessment/Plan: s/p Procedure(s): LAPAROSCOPIC POSSIBLE OPEN  INCISIONAL HERNIA REPAIR INSERTION OF MESH  POD #1.  Laparoscopic repair incisional hernia with mesh Stable Has ileus, so go slow with diet advancement Mobilize Incentive spirometry  Urinary retention. Start Flomax now May require indwelling Foley temporarily.  Discussed with patient  DVT prophylaxis.  On Lovenox and SCDs.  @PROBHOSP @     Amreen Raczkowski M 12/25/2014  . .prob

## 2014-12-26 DIAGNOSIS — K432 Incisional hernia without obstruction or gangrene: Secondary | ICD-10-CM | POA: Diagnosis present

## 2014-12-26 DIAGNOSIS — Z9049 Acquired absence of other specified parts of digestive tract: Secondary | ICD-10-CM | POA: Diagnosis present

## 2014-12-26 DIAGNOSIS — R339 Retention of urine, unspecified: Secondary | ICD-10-CM | POA: Diagnosis present

## 2014-12-26 MED ORDER — BISACODYL 10 MG RE SUPP
10.0000 mg | Freq: Two times a day (BID) | RECTAL | Status: DC
  Administered 2014-12-26 (×2): 10 mg via RECTAL
  Filled 2014-12-26 (×3): qty 1

## 2014-12-26 MED ORDER — KETOROLAC TROMETHAMINE 30 MG/ML IJ SOLN
30.0000 mg | Freq: Four times a day (QID) | INTRAMUSCULAR | Status: DC | PRN
  Administered 2014-12-26 – 2014-12-27 (×2): 30 mg via INTRAVENOUS
  Filled 2014-12-26 (×3): qty 1

## 2014-12-26 NOTE — Progress Notes (Signed)
2 Days Post-Op  Subjective: Patient notes mild intermittent nausea.  Think it's related to narcotic.  Percocet doesn't help the Dilaudid is very effective. Tolerating clear liquids, according to him No stool or flatus Ambulating in hall twice yesterday  Could not void yesterday despite Flomax and Foley inserted.  I told him we would leave this in until tomorrow and have voiding trial tomorrow. He says this happened to him following his cholecystectomy and bile leak.  He was placed on Flomax and had a catheter for 2 days and then everything straightened out.  Objective: Vital signs in last 24 hours: Temp:  [98.3 F (36.8 C)-98.4 F (36.9 C)] 98.4 F (36.9 C) (07/08 0443) Pulse Rate:  [71-86] 80 (07/08 0443) Resp:  [16-18] 16 (07/08 0443) BP: (101-105)/(51-69) 103/69 mmHg (07/08 0443) SpO2:  [93 %-98 %] 94 % (07/08 0443)    Intake/Output from previous day: 07/07 0701 - 07/08 0700 In: 800 [I.V.:800] Out: 3500 [Urine:3500] Intake/Output this shift:    General appearance: Alert.  Cooperative.  Minimal distress. Resp: clear to auscultation bilaterally GI: Soft.  Appropriately tender.  Bowel sounds present.  Wounds look fine.  Foley in place with clear urine.  Lab Results:  No results found for this or any previous visit (from the past 24 hour(s)).   Studies/Results: No results found.  . bisacodyl  10 mg Rectal BID  . enoxaparin (LOVENOX) injection  40 mg Subcutaneous Q24H  . tamsulosin  0.4 mg Oral QPC breakfast     Assessment/Plan: s/p Procedure(s): LAPAROSCOPIC POSSIBLE OPEN  INCISIONAL HERNIA REPAIR INSERTION OF MESH  POD #2. Laparoscopic repair incisional hernia with mesh Stable Has ileus, although he does have active bowel sounds ,so go slow with diet advancement Mobilize Incentive spirometry Dulcolax suppository twice a day We'll try to use Toradol instead of narcotic in hopes that there will be less nausea and quicker return of GI function  Urinary  retention. Started on Flomax yesterday morning.   Foley inserted yesterday midday.   Continue Foley until tomorrow and then removed with voiding trial.   Discussed with patient.  He is comfortable with this plan.  DVT prophylaxis. On Lovenox and SCDs.  @PROBHOSP @     Harry Warren M 12/26/2014  . .prob

## 2014-12-27 LAB — CLOSTRIDIUM DIFFICILE BY PCR: Toxigenic C. Difficile by PCR: NEGATIVE

## 2014-12-27 MED ORDER — ALUM & MAG HYDROXIDE-SIMETH 200-200-20 MG/5ML PO SUSP
30.0000 mL | Freq: Four times a day (QID) | ORAL | Status: DC | PRN
Start: 2014-12-27 — End: 2014-12-28

## 2014-12-27 MED ORDER — DIPHENHYDRAMINE HCL 50 MG/ML IJ SOLN: 12.5000 mg | Freq: Four times a day (QID) | INTRAMUSCULAR | Status: DC | PRN

## 2014-12-27 MED ORDER — SODIUM CHLORIDE 0.9 % IJ SOLN: 3.0000 mL | Freq: Two times a day (BID) | INTRAMUSCULAR | Status: DC

## 2014-12-27 MED ORDER — ACETAMINOPHEN 325 MG PO TABS
325.0000 mg | ORAL_TABLET | Freq: Four times a day (QID) | ORAL | Status: DC | PRN
  Administered 2014-12-28: 650 mg via ORAL
  Filled 2014-12-27: qty 2

## 2014-12-27 MED ORDER — LACTATED RINGERS IV BOLUS (SEPSIS): 1000.0000 mL | Freq: Three times a day (TID) | INTRAVENOUS | Status: DC | PRN

## 2014-12-27 MED ORDER — MENTHOL 3 MG MT LOZG
1.0000 | LOZENGE | OROMUCOSAL | Status: DC | PRN
  Filled 2014-12-27: qty 9

## 2014-12-27 MED ORDER — MAGIC MOUTHWASH
15.0000 mL | Freq: Four times a day (QID) | ORAL | Status: DC | PRN
  Filled 2014-12-27: qty 15

## 2014-12-27 MED ORDER — LIP MEDEX EX OINT
1.0000 "application " | TOPICAL_OINTMENT | Freq: Two times a day (BID) | CUTANEOUS | Status: DC
  Administered 2014-12-27 (×2): 1 via TOPICAL
  Filled 2014-12-27: qty 7

## 2014-12-27 MED ORDER — METOPROLOL TARTRATE 1 MG/ML IV SOLN: 5.0000 mg | Freq: Four times a day (QID) | INTRAVENOUS | Status: DC | PRN

## 2014-12-27 MED ORDER — SODIUM CHLORIDE 0.9 % IJ SOLN: 3.0000 mL | INTRAMUSCULAR | Status: DC | PRN

## 2014-12-27 MED ORDER — VITAMIN C 500 MG PO TABS
500.0000 mg | ORAL_TABLET | Freq: Two times a day (BID) | ORAL | Status: DC
  Administered 2014-12-27 – 2014-12-28 (×3): 500 mg via ORAL
  Filled 2014-12-27 (×4): qty 1

## 2014-12-27 MED ORDER — METOPROLOL TARTRATE 12.5 MG HALF TABLET
12.5000 mg | ORAL_TABLET | Freq: Two times a day (BID) | ORAL | Status: DC | PRN
  Filled 2014-12-27: qty 1

## 2014-12-27 MED ORDER — NAPROXEN 500 MG PO TABS
500.0000 mg | ORAL_TABLET | Freq: Two times a day (BID) | ORAL | Status: DC
  Administered 2014-12-27 – 2014-12-28 (×2): 500 mg via ORAL
  Filled 2014-12-27 (×4): qty 1

## 2014-12-27 MED ORDER — PROMETHAZINE HCL 25 MG/ML IJ SOLN: 6.2500 mg | INTRAMUSCULAR | Status: DC | PRN

## 2014-12-27 MED ORDER — SODIUM CHLORIDE 0.9 % IV SOLN: 250.0000 mL | INTRAVENOUS | Status: DC | PRN

## 2014-12-27 MED ORDER — PHENOL 1.4 % MT LIQD
2.0000 | OROMUCOSAL | Status: DC | PRN
  Filled 2014-12-27: qty 177

## 2014-12-27 MED ORDER — ACETAMINOPHEN 650 MG RE SUPP: 650.0000 mg | Freq: Four times a day (QID) | RECTAL | Status: DC | PRN

## 2014-12-27 MED ORDER — OXYCODONE HCL 5 MG PO TABS: 5.0000 mg | ORAL_TABLET | ORAL | Status: DC | PRN

## 2014-12-27 MED ORDER — GUAIFENESIN-DM 100-10 MG/5ML PO SYRP: 15.0000 mL | ORAL_SOLUTION | ORAL | Status: DC | PRN

## 2014-12-27 NOTE — Progress Notes (Signed)
[facility]  [address]  [place], [place] [address] Phone: [phone] FAX: [phone]   Harry Warren [id number] Sep 26, 1965   Problem List:   Active Problems:   Incisional hernia   History of incisional hernia repair   3 Days Post-Op   Patient Name: Harry Warren  Date of Surgery: 12/24/2014  Pre op Diagnosis: Incisional hernia  Post op Diagnosis: Incisional hernia  Procedure: Laparoscopic repair of incisional hernia with 15 cm diameter ventraLite - ST mesh inlay  Surgeon: Angelia Mould. Derrell Lolling, M.D., FACS  Operative Findings: There were minimal adhesions to the anterior abdominal wall. The hernia defect was about 2-2.5 cm in diameter. I used a 15 cm diameter circular piece of ventral light ST mesh. There were no abnormalities of the abdominal viscera on Stephanie Littman exam.  Assessment  Recovering  Plan:  Adv diet Mobilize Incentive spirometry Switch to PO pain control Wean off IVF   Urinary retention. Started on Flomax yesterday morning.  Foley inserted yesterday midday.  Continue Foley until MN and then removed with voiding trial. If failes, replace w legbag & Urology outpt f/u Discussed with patient. He is comfortable with this plan.  DVT prophylaxis. On Lovenox and SCDs.   Ardeth Sportsman, M.D., F.A.C.S. Gastrointestinal and Minimally Invasive Surgery [facility] [address] [place], [place] [address] [phone] Main / Paging   12/27/2014  Subjective:  Walking Less sore  Objective:  Vital signs:  Filed Vitals:   12/25/14 2144 12/26/14 0443 12/26/14 2201 12/27/14 0557  BP: 105/51 103/69 115/78 120/73  Pulse: 86 80 77 70  Temp: 98.3 F (36.8 C) 98.4 F (36.9 C) 98.8 F (37.1 C) 98 F (36.7 C)  TempSrc: Oral Oral Oral Oral  Resp: Height:      Weight:       SpO2: 93% 94% 95% 97%    Last BM Date: 12/24/14  Intake/Output   Yesterday:  07/08 0701 - 07/09 0700 In: 240 [P.O.:240] Out: 3450 [Urine:3450] This shift:  Total I/O In: -  Out: 1100 [Urine:1100]  Bowel function:  Flatus: y  BM: y  Drain: foley clear yellow UOP  Physical Exam:  General: Pt awake/alert/oriented x4 in no acute distress.  Tired but not sickly Eyes: PERRL, normal EOM.  Sclera clear.  No icterus Neuro: CN II-XII intact w/o focal sensory/motor deficits. Lymph: No head/neck/groin lymphadenopathy Psych:  No delerium/psychosis/paranoia HENT: Normocephalic, Mucus membranes moist.  No thrush Neck: Supple, No tracheal deviation Chest: No chest wall pain w good excursion CV:  Pulses intact.  Regular rhythm MS: Normal AROM mjr joints.  No obvious deformity Abdomen: Soft.  Nondistended.  Mildly tender at incisions only.  No evidence of peritonitis.  No incarcerated hernias. Ext:  SCDs BLE.  No mjr edema.  No cyanosis Skin: No petechiae / purpura  Results:   Labs: No results found for this or any previous visit (from the past 48 hour(s)).  Imaging / Studies: No results found.  Medications / Allergies: per chart  Antibiotics: Anti-infectives    Start     Dose/Rate Route Frequency Ordered Stop   12/24/14 1500  ceFAZolin (ANCEF) IVPB 2 g/50 mL premix     2 g 100 mL/hr over 30 Minutes Intravenous Every 6 hours 12/24/14 1208 12/25/14 0414   12/24/14 0626  ceFAZolin (ANCEF) IVPB 2 g/50 mL premix     2 g 100 mL/hr over 30 Minutes Intravenous On call  to O.R. 12/24/14 [id number] 12/24/14 [id number]        Note: Portions of this report may have been transcribed using voice recognition software. Every effort was made to ensure accuracy; however, inadvertent computerized transcription errors may be present.   Any transcriptional errors that result from this process are unintentional.     Ardeth SportsmanSteven C. Ramon Brant, M.D., F.A.C.S. Gastrointestinal and Minimally Invasive  Surgery [facility] [address] [place], [place] [id number] [phone] Main / Paging   12/27/2014  CARE TEAM:  PCP: Pcp Not In System  Outpatient Care Team: Patient Care Team: Pcp Not In System as PCP - General  Inpatient Treatment Team: Treatment Team: Attending Provider: Claud KelpHaywood Ingram, MD; Consulting Physician: Bishop LimboMd Ccs, MD; Registered Nurse: Rondel JumboNakiyha S Dumas, RN; Registered Nurse: Mardene CelesteEsther O Edgal, RN; Registered Nurse: Lestine Boxhelsea J Young, RN; Technician: Leda QuailMorgan C East, NT; Registered Nurse: Dwain Sarnaerresa H Parks, RN

## 2014-12-27 NOTE — Progress Notes (Signed)
Patient ambulating in hall and became incontinent of stool.  Returned to room.  Patient denies pain, denies, nausea, reports weakness.  VS taken.  Paged physician and no return call.  Patient given shower and returned to bed.  Reports no needs, reports he feels better, denies pain, denies nausea. Wife at bedside.

## 2014-12-28 ENCOUNTER — Encounter (HOSPITAL_COMMUNITY): Payer: Self-pay | Admitting: Surgery

## 2014-12-28 DIAGNOSIS — R338 Other retention of urine: Secondary | ICD-10-CM

## 2014-12-28 DIAGNOSIS — N400 Enlarged prostate without lower urinary tract symptoms: Secondary | ICD-10-CM

## 2014-12-28 HISTORY — DX: Benign prostatic hyperplasia without lower urinary tract symptoms: N40.0

## 2014-12-28 MED ORDER — SODIUM CHLORIDE 0.9 % IJ SOLN: 3.0000 mL | Freq: Two times a day (BID) | INTRAMUSCULAR | Status: DC

## 2014-12-28 MED ORDER — TRAMADOL HCL 50 MG PO TABS: 50.0000 mg | ORAL_TABLET | Freq: Four times a day (QID) | ORAL | Status: DC | PRN

## 2014-12-28 MED ORDER — PROMETHAZINE HCL 12.5 MG PO TABS: 12.5000 mg | ORAL_TABLET | Freq: Four times a day (QID) | ORAL | Status: DC | PRN

## 2014-12-28 MED ORDER — SODIUM CHLORIDE 0.9 % IJ SOLN: 3.0000 mL | INTRAMUSCULAR | Status: DC | PRN

## 2014-12-28 MED ORDER — IBUPROFEN 800 MG PO TABS: 800.0000 mg | ORAL_TABLET | Freq: Four times a day (QID) | ORAL | Status: DC

## 2014-12-28 MED ORDER — SODIUM CHLORIDE 0.9 % IV SOLN: 250.0000 mL | INTRAVENOUS | Status: DC | PRN

## 2014-12-28 MED ORDER — TAMSULOSIN HCL 0.4 MG PO CAPS: 0.4000 mg | ORAL_CAPSULE | Freq: Every day | ORAL | Status: DC

## 2014-12-28 MED ORDER — IBUPROFEN 800 MG PO TABS
800.0000 mg | ORAL_TABLET | Freq: Four times a day (QID) | ORAL | Status: DC
  Administered 2014-12-28: 800 mg via ORAL
  Filled 2014-12-28 (×4): qty 1

## 2014-12-28 NOTE — Discharge Instructions (Signed)
HERNIA REPAIR: POST OP INSTRUCTIONS ° °1. DIET: Follow a light bland diet the first 24 hours after arrival home, such as soup, liquids, crackers, etc.  Be sure to include lots of fluids daily.  Avoid fast food or heavy meals as your are more likely to get nauseated.  Eat a low fat the next few days after surgery. °2. Take your usually prescribed home medications unless otherwise directed. °3. PAIN CONTROL: °a. Pain is best controlled by a usual combination of three different methods TOGETHER: °i. Ice/Heat °ii. Over the counter pain medication °iii. Prescription pain medication °b. Most patients will experience some swelling and bruising around the hernia(s) such as the bellybutton, groins, or old incisions.  Ice packs or heating pads (30-60 minutes up to 6 times a day) will help. Use ice for the first few days to help decrease swelling and bruising, then switch to heat to help relax tight/sore spots and speed recovery.  Some people prefer to use ice alone, heat alone, alternating between ice & heat.  Experiment to what works for you.  Swelling and bruising can take several weeks to resolve.   °c. It is helpful to take an over-the-counter pain medication regularly for the first few weeks.  Choose one of the following that works best for you: °i. Naproxen (Aleve, etc)  Two 220mg tabs twice a day °ii. Ibuprofen (Advil, etc) Three 200mg tabs four times a day (every meal & bedtime) °iii. Acetaminophen (Tylenol, etc) 325-650mg four times a day (every meal & bedtime) °d. A  prescription for pain medication should be given to you upon discharge.  Take your pain medication as prescribed.  °i. If you are having problems/concerns with the prescription medicine (does not control pain, nausea, vomiting, rash, itching, etc), please call us [phone] to see if we need to switch you to a different pain medicine that will work better for you and/or control your side effect better. °ii. If you need a refill on your pain  medication, please contact your pharmacy.  They will contact our office to request authorization. Prescriptions will not be filled after 5 pm or on week-ends. °4. Avoid getting constipated.  Between the surgery and the pain medications, it is common to experience some constipation.  Increasing fluid intake and taking a fiber supplement (such as Metamucil, Citrucel, FiberCon, MiraLax, etc) 1-2 times a day regularly will usually help prevent this problem from occurring.  A mild laxative (prune juice, Milk of Magnesia, MiraLax, etc) should be taken according to package directions if there are no bowel movements after 48 hours.   °5. Wash / shower every day.  You may shower over the dressings as they are waterproof.   °6. Remove your waterproof bandages 5 days after surgery.  You may leave the incision open to air.  You may replace a dressing/Band-Aid to cover the incision for comfort if you wish.  Continue to shower over incision(s) after the dressing is off. ° ° ° °7. ACTIVITIES as tolerated:   °a. You may resume regular (light) daily activities beginning the next day--such as daily self-care, walking, climbing stairs--gradually increasing activities as tolerated.  If you can walk 30 minutes without difficulty, it is safe to try more intense activity such as jogging, treadmill, bicycling, low-impact aerobics, swimming, etc. °b. Save the most intensive and strenuous activity for last such as sit-ups, heavy lifting, contact sports, etc  Refrain from any heavy lifting or straining until you are off narcotics for pain control.   °  c. DO NOT PUSH THROUGH PAIN.  Let pain be your guide: If it hurts to do something, don't do it.  Pain is your body warning you to avoid that activity for another week until the pain goes down. °d. You may drive when you are no longer taking prescription pain medication, you can comfortably wear a seatbelt, and you can safely maneuver your car and apply brakes. °e. You may have sexual intercourse  when it is comfortable.  °8. FOLLOW UP in our office °a. Please call [facility] at [phone] to set up an appointment to see your surgeon in the office for a follow-up appointment approximately 2-3 weeks after your surgery. °b. Make sure that you call for this appointment the day you arrive home to insure a convenient appointment time. °9.  IF YOU HAVE DISABILITY OR FAMILY LEAVE FORMS, BRING THEM TO THE OFFICE FOR PROCESSING.  DO NOT GIVE THEM TO YOUR DOCTOR. ° °WHEN TO CALL US [phone]: °1. Poor pain control °2. Reactions / problems with new medications (rash/itching, nausea, etc)  °3. Fever over 101.5 F (38.5 C) °4. Inability to urinate °5. Nausea and/or vomiting °6. Worsening swelling or bruising °7. Continued bleeding from incision. °8. Increased pain, redness, or drainage from the incision ° ° The clinic staff is available to answer your questions during regular business hours (8:30am-5pm).  Please don’t hesitate to call and ask to speak to one of our nurses for clinical concerns.  ° If you have a medical emergency, go to the nearest emergency room or call 911. ° A surgeon from [facility] is always on call at the hospitals in [place] ° °[facility] °[address], [address], [place], [place]  [address] ? ° [address] [address], [place], [place]   [address] °MAIN: [phone] ? TOLL FREE: [phone] ? FAX: [phone] °[email or web address] ° °Managing Pain ° °Pain after surgery or related to activity is often due to strain/injury to muscle, tendon, nerves and/or incisions.  This pain is usually short-term and will improve in a few months.  ° °Many people find it helpful to do the following things TOGETHER to help speed the process of healing and to get back to regular activity more quickly: ° °1. Avoid heavy physical activity at first °a. No lifting greater than 20 pounds at first, then increase to lifting as tolerated over the next few weeks °b. Do not “push  through” the pain.  Listen to your body and avoid positions and maneuvers than reproduce the pain.  Wait a few days before trying something more intense °c. Walking is okay as tolerated, but go slowly and stop when getting sore.  If you can walk 30 minutes without stopping or pain, you can try more intense activity (running, jogging, aerobics, cycling, swimming, treadmill, sex, sports, weightlifting, etc ) °d. Remember: If it hurts to do it, then don’t do it! ° °2. Take Anti-inflammatory medication °a. Choose ONE of the following over-the-counter medications: °i.            Acetaminophen 500mg tabs (Tylenol) 1-2 pills with every meal and just before bedtime (avoid if you have liver problems) °ii.            Naproxen 220mg tabs (ex. Aleve) 1-2 pills twice a day (avoid if you have kidney, stomach, IBD, or bleeding problems) °iii. Ibuprofen 200mg tabs (ex. Advil, Motrin) 3-4 pills with every meal and just before bedtime (avoid if you have kidney, stomach, IBD, or bleeding   problems) °b. Take with food/snack around the clock for 1-2 weeks °i. This helps the muscle and nerve tissues become less irritable and calm down faster ° °3. Use a Heating pad or Ice/Cold Pack °a. 4-6 times a day °b. May use warm bath/hottub  or showers ° °4. Try Gentle Massage and/or Stretching  °a. at the area of pain many times a day °b. stop if you feel pain - do not overdo it ° °Try these steps together to help you body heal faster and avoid making things get worse.  Doing just one of these things may not be enough.   ° °If you are not getting better after two weeks or are noticing you are getting worse, contact our office for further advice; we may need to re-evaluate you & see what other things we can do to help. ° °GETTING TO GOOD BOWEL HEALTH. °Irregular bowel habits such as constipation and diarrhea can lead to many problems over time.  Having one soft bowel movement a day is the most important way to prevent further problems.  The  anorectal canal is designed to handle stretching and feces to safely manage our ability to get rid of solid waste (feces, poop, stool) out of our body.  BUT, hard constipated stools can act like ripping concrete bricks and diarrhea can be a burning fire to this very sensitive area of our body, causing inflamed hemorrhoids, anal fissures, increasing risk is perirectal abscesses, abdominal pain/bloating, an making irritable bowel worse.     ° °The goal: ONE SOFT BOWEL MOVEMENT A DAY!  To have soft, regular bowel movements:  °• Drink plenty of fluids, consider 4-6 tall glasses of water a day.   °• Take plenty of fiber.  Fiber is the undigested part of plant food that passes into the colon, acting s “natures broom” to encourage bowel motility and movement.  Fiber can absorb and hold large amounts of water. This results in a larger, bulkier stool, which is soft and easier to pass. Work gradually over several weeks up to 6 servings a day of fiber (25g a day even more if needed) in the form of: °o Vegetables -- Root (potatoes, carrots, turnips), leafy green (lettuce, salad greens, celery, spinach), or cooked high residue (cabbage, broccoli, etc) °o Fruit -- Fresh (unpeeled skin & pulp), Dried (prunes, apricots, cherries, etc ),  or stewed ( applesauce)  °o Whole grain breads, pasta, etc (whole wheat)  °o Bran cereals  °• Bulking Agents -- This type of water-retaining fiber generally is easily obtained each day by one of the following:  °o Psyllium bran -- The psyllium plant is remarkable because its ground seeds can retain so much water. This product is available as Metamucil, Konsyl, Effersyllium, Per Diem Fiber, or the less expensive generic preparation in drug and health food stores. Although labeled a laxative, it really is not a laxative.  °o Methylcellulose -- This is another fiber derived from wood which also retains water. It is available as Citrucel. °o Polyethylene Glycol - and “artificial” fiber commonly called  Miralax or Glycolax.  It is helpful for people with gassy or bloated feelings with regular fiber °o Flax Seed - a less gassy fiber than psyllium °• No reading or other relaxing activity while on the toilet. If bowel movements take longer than 5 minutes, you are too constipated °• AVOID CONSTIPATION.  High fiber and water intake usually takes care of this.  Sometimes a laxative is needed to stimulate more frequent   bowel movements, but  °• Laxatives are not a good long-term solution as it can wear the colon out.  They can help jump-start bowels if constipated, but should be relied on constantly without discussing with your doctor °o Osmotics (Milk of Magnesia, Fleets phosphosoda, Magnesium citrate, MiraLax, GoLytely) are safer than  °o Stimulants (Senokot, Castor Oil, Dulcolax, Ex Lax)    °o Avoid taking laxatives for more than 7 days in a row. °•  IF SEVERELY CONSTIPATED, try a Bowel Retraining Program: °o Do not use laxatives.  °o Eat a diet high in roughage, such as bran cereals and leafy vegetables.  °o Drink six (6) ounces of prune or apricot juice each morning.  °o Eat two (2) large servings of stewed fruit each day.  °o Take one (1) heaping tablespoon of a psyllium-based bulking agent twice a day. Use sugar-free sweetener when possible to avoid excessive calories.  °o Eat a normal breakfast.  °o Set aside 15 minutes after breakfast to sit on the toilet, but do not strain to have a bowel movement.  °o If you do not have a bowel movement by the third day, use an enema and repeat the above steps.  °• Controlling diarrhea °o Switch to liquids and simpler foods for a few days to avoid stressing your intestines further. °o Avoid dairy products (especially milk & ice cream) for a short time.  The intestines often can lose the ability to digest lactose when stressed. °o Avoid foods that cause gassiness or bloating.  Typical foods include beans and other legumes, cabbage, broccoli, and dairy foods.  Every person has  some sensitivity to other foods, so listen to our body and avoid those foods that trigger problems for you. °o Adding fiber (Citrucel, Metamucil, psyllium, Miralax) gradually can help thicken stools by absorbing excess fluid and retrain the intestines to act more normally.  Slowly increase the dose over a few weeks.  Too much fiber too soon can backfire and cause cramping & bloating. °o Probiotics (such as active yogurt, Align, etc) may help repopulate the intestines and colon with normal bacteria and calm down a sensitive digestive tract.  Most studies show it to be of mild help, though, and such products can be costly. °o Medicines: °- Bismuth subsalicylate (ex. Kayopectate, Pepto Bismol) every 30 minutes for up to 6 doses can help control diarrhea.  Avoid if pregnant. °- Loperamide (Immodium) can slow down diarrhea.  Start with two tablets (4mg total) first and then try one tablet every 6 hours.  Avoid if you are having fevers or severe pain.  If you are not better or start feeling worse, stop all medicines and call your doctor for advice °o Call your doctor if you are getting worse or not better.  Sometimes further testing (cultures, endoscopy, X-ray studies, bloodwork, etc) may be needed to help diagnose and treat the cause of the diarrhea. ° °TROUBLESHOOTING IRREGULAR BOWELS °1) Avoid extremes of bowel movements (no bad constipation/diarrhea) °2) Miralax 17gm mixed in 8oz. water or juice-daily. May use BID as needed.  °3) Gas-x,Phazyme, etc. as needed for gas & bloating.  °4) Soft,bland diet. No spicy,greasy,fried foods.  °5) Prilosec over-the-counter as needed  °6) May hold gluten/wheat products from diet to see if symptoms improve.  °7)  May try probiotics (Align, Activa, etc) to help calm the bowels down °7) If symptoms become worse call back immediately. ° °Hernia Repair with Laparoscope °A hernia occurs when an internal organ pushes out through a weak   spot in the belly (abdominal) wall muscles. Hernias  most commonly occur in the groin and around the navel. Hernias can also occur through a cut by the surgeon (incision) after an abdominal operation. A hernia may be caused by: °· Lifting heavy objects. °· Prolonged coughing. °· Straining to move your bowels. °Hernias can often be pushed back into place (reduced). Most hernias tend to get worse over time. Problems occur when abdominal contents get stuck in the opening and the blood supply is blocked or impaired (incarcerated hernia). Because of these risks, you require surgery to repair the hernia. °Your hernia will be repaired using a laparoscope. Laparoscopic surgery is a type of minimally invasive surgery. It does not involve making a typical surgical cut (incision) in the skin. A laparoscope is a telescope-like rod and lens system. It is usually connected to a video camera and a light source so your caregiver can clearly see the operative area. The instruments are inserted through ¼ to ½ inch (5 mm or 10 mm) openings in the skin at specific locations. A working and viewing space is created by blowing a small amount of carbon dioxide gas into the abdominal cavity. The abdomen is essentially blown up like a balloon (insufflated). This elevates the abdominal wall above the internal organs like a dome. The carbon dioxide gas is common to the human body and can be absorbed by tissue and removed by the respiratory system. Once the repair is completed, the small incisions will be closed with either stitches (sutures) or staples (just like a paper stapler only this staple holds the skin together). °LET YOUR CAREGIVERS KNOW ABOUT: °· Allergies. °· Medications taken including herbs, eye drops, over the counter medications, and creams. °· Use of steroids (by mouth or creams). °· Previous problems with anesthetics or Novocaine. °· Possibility of pregnancy, if this applies. °· History of blood clots (thrombophlebitis). °· History of bleeding or blood problems. °· Previous  surgery. °· Other health problems. °BEFORE THE PROCEDURE  °Laparoscopy can be done either in a hospital or out-patient clinic. You may be given a mild sedative to help you relax before the procedure. Once in the operating room, you will be given a general anesthesia to make you sleep (unless you and your caregiver choose a different anesthetic).  °AFTER THE PROCEDURE  °After the procedure you will be watched in a recovery area. Depending on what type of hernia was repaired, you might be admitted to the hospital or you might go home the same day. With this procedure you may have less pain and scarring. This usually results in a quicker recovery and less risk of infection. °HOME CARE INSTRUCTIONS  °· Bed rest is not required. You may continue your normal activities but avoid heavy lifting (more than 10 pounds) or straining. °· Cough gently. If you are a smoker it is best to stop, as even the best hernia repair can break down with the continual strain of coughing. °· Avoid driving until given the OK by your surgeon. °· There are no dietary restrictions unless given otherwise. °· TAKE ALL MEDICATIONS AS DIRECTED. °· Only take over-the-counter or prescription medicines for pain, discomfort, or fever as directed by your caregiver. °SEEK MEDICAL CARE IF:  °· There is increasing abdominal pain or pain in your incisions. °· There is more bleeding from incisions, other than minimal spotting. °· You feel light headed or faint. °· You develop an unexplained fever, chills, and/or an oral temperature above 102° F (38.9° C). °·   You have redness, swelling, or increasing pain in the wound. °· Pus coming from wound. °· A foul smell coming from the wound or dressings. °SEEK IMMEDIATE MEDICAL CARE IF:  °· You develop a rash. °· You have difficulty breathing. °· You have any allergic problems. °MAKE SURE YOU:  °· Understand these instructions. °· Will watch your condition. °· Will get help right away if you are not doing well or get  worse. °Document Released: 06/06/2005 Document Revised: 08/29/2011 Document Reviewed: 05/06/2009 °[employer]® Patient Information ©2015 [employer]. This information is not intended to replace advice given to you by your health care provider. Make sure you discuss any questions you have with your health care provider. ° °

## 2014-12-28 NOTE — Progress Notes (Signed)
Patient and wife report [facility] and requests meds be called to [facility], [address], [place], [place].  Dr. Romie LeveeAlicia Thomas reports she will take care of this and for the patient to check with [facility] prior to picking up prescriptions later today.

## 2014-12-28 NOTE — Progress Notes (Signed)
Patient discharged home.  Leaving with one handwritten prescription and three at pharmacy.  Wife at side.  No complaints.  No s/s of pain or distress.

## 2014-12-28 NOTE — Discharge Summary (Signed)
Physician Discharge Summary  Patient ID: Harry Warren MRN: [id number] DOB/AGE: 1965/11/23 49 y.o.  Admit date: 12/24/2014 Discharge date: 12/28/2014  Patient Care Team: Joycelyn RuaStephen Meyers, MD as PCP - General (Family Medicine) Claud KelpHaywood Ingram, MD as Consulting Physician (General Surgery)  Admission Diagnoses: Principal Problem:   Incisional hernia s/p lap repair w mesh 12/24/2014 Active Problems:   Acute urinary retention   Discharge Diagnoses:  Principal Problem:   Incisional hernia s/p lap repair w mesh 12/24/2014   POST-OPERATIVE DIAGNOSIS:  incisional hernia  SURGERY:  12/24/2014  LAPAROSCOPIC INCISIONAL HERNIA REPAIR INSERTION OF MESH  SURGEON:  Surgeon(s): Claud KelpHaywood Ingram, MD  Consults: None  Hospital Course:   The patient underwent the surgery above.  Postoperatively, the patient gradually mobilized.  He did have nausea w ileus vs difficulty w narcotics but improved with NSAID & IV Dilaudid.  Eventually advanced to a solid diet.  Pain and other symptoms were treated aggressively.   Problem w urination - Foley placed.  Flomax started & foley removed - voiding well on day of d/c.  Loose BM yesterday but no problem today   By the time of discharge, the patient was walking well the hallways, eating food, having flatus.  Pain was well-controlled on an oral medications.  Based on meeting discharge criteria and continuing to recover, I felt it was safe for the patient to be discharged from the hospital to further recover with close followup. Postoperative recommendations were discussed in detail.  Many times.  They are written as well.   Significant Diagnostic Studies:  Results for orders placed or performed during the hospital encounter of 12/24/14 (from the past 72 hour(s))  Clostridium Difficile by PCR (not at [facility])     Status: None   Collection Time: 12/27/14  7:00 PM  Result Value Ref Range   C difficile by pcr NEGATIVE NEGATIVE    No results found.  Discharge  Exam: Blood pressure 127/80, pulse 68, temperature 97.8 F (36.6 C), temperature source Oral, resp. rate 18, height 5\' 11"  (1.803 m), weight 88.7 kg (195 lb 8.8 oz), SpO2 98 %.  General: Pt awake/alert/oriented x4 in no major acute distress Eyes: PERRL, normal EOM. Sclera nonicteric Neuro: CN II-XII intact w/o focal sensory/motor deficits. Lymph: No head/neck/groin lymphadenopathy Psych:  No delerium/psychosis/paranoia HENT: Normocephalic, Mucus membranes moist.  No thrush Neck: Supple, No tracheal deviation Chest: No pain.  Good respiratory excursion. CV:  Pulses intact.  Regular rhythm MS: Normal AROM mjr joints.  No obvious deformity Abdomen: Soft, Nondistended.  Min tender at periumbilical incisions.  No incarcerated hernias. Ext:  SCDs BLE.  No significant edema.  No cyanosis Skin: No petechiae / purpura  Discharged Condition: good   Past Medical History  Diagnosis Date  . Family history of adverse reaction to anesthesia     pts mother has difficulty awakening after anethesia   . Urinary hesitancy     difficulty with urination following anesthesia   . Bile leak, postoperative 2011    s/p [place]ole - [place]    Past Surgical History  Procedure Laterality Date  . Cholecystectomy  03/04/2010    [facility] [place]  . Ercp w/ metal stent placement  2011  . Incisional hernia repair N/A 12/24/2014    Procedure: LAPAROSCOPIC POSSIBLE OPEN  INCISIONAL HERNIA REPAIR;  Surgeon: Claud KelpHaywood Ingram, MD;  Location: [facility];  Service: General;  Laterality: N/A;  . Insertion of mesh N/A 12/24/2014  Procedure: INSERTION OF MESH;  Surgeon: Claud Kelp, MD;  Location: [facility];  Service: General;  Laterality: N/A;    History   Social History  . Marital Status: Married    Spouse Name: N/A  . Number of Children: N/A  . Years of Education: N/A   Occupational History  . Not on file.   Social History Main Topics  . Smoking status: Never  Smoker   . Smokeless tobacco: Never Used  . Alcohol Use: Yes     Comment: glass of wine occas  . Drug Use: No  . Sexual Activity: Not on file   Other Topics Concern  . Not on file   Social History Narrative   Jehovah's Witness      Very deferential to his wife    History reviewed. No pertinent family history.  Current Facility-Administered Medications  Medication Dose Route Frequency Provider Last Rate Last Dose  . 0.9 %  sodium chloride infusion  250 mL Intravenous PRN Karie Soda, MD      . 0.9 %  sodium chloride infusion  250 mL Intravenous PRN Karie Soda, MD      . acetaminophen (TYLENOL) suppository 650 mg  650 mg Rectal Q6H PRN Karie Soda, MD      . acetaminophen (TYLENOL) tablet 325-650 mg  325-650 mg Oral Q6H PRN Karie Soda, MD   650 mg at 12/28/14 0056  . alum & mag hydroxide-simeth (MAALOX/MYLANTA) 200-200-20 MG/5ML suspension 30 mL  30 mL Oral Q6H PRN Karie Soda, MD      . diphenhydrAMINE (BENADRYL) injection 12.5-25 mg  12.5-25 mg Intravenous Q6H PRN Karie Soda, MD      . enoxaparin (LOVENOX) injection 40 mg  40 mg Subcutaneous Q24H Claud Kelp, MD   40 mg at 12/27/14 1003  . guaiFENesin-dextromethorphan (ROBITUSSIN DM) 100-10 MG/5ML syrup 15 mL  15 mL Oral Q4H PRN Karie Soda, MD      . HYDROmorphone (DILAUDID) injection 1 mg  1 mg Intravenous Q1H PRN Claud Kelp, MD   1 mg at 12/26/14 1610  . ibuprofen (ADVIL,MOTRIN) tablet 800 mg  800 mg Oral QID Karie Soda, MD      . lactated ringers bolus 1,000 mL  1,000 mL Intravenous Q8H PRN Karie Soda, MD      . lip balm (CARMEX) ointment 1 application  1 application Topical BID Karie Soda, MD   1 application at 12/27/14 2200  . magic mouthwash  15 mL Oral QID PRN Karie Soda, MD      . menthol-cetylpyridinium (CEPACOL) lozenge 3 mg  1 lozenge Oral PRN Karie Soda, MD      . methocarbamol (ROBAXIN) 500 mg in dextrose 5 % 50 mL IVPB  500 mg Intravenous Q6H PRN Claud Kelp, MD   500 mg at 12/27/14 2239   . metoprolol (LOPRESSOR) injection 5 mg  5 mg Intravenous Q6H PRN Karie Soda, MD      . metoprolol tartrate (LOPRESSOR) tablet 12.5 mg  12.5 mg Oral Q12H PRN Karie Soda, MD      . ondansetron [facility]) tablet 4 mg  4 mg Oral Q6H PRN Claud Kelp, MD       Or  . ondansetron [facility]) injection 4 mg  4 mg Intravenous Q6H PRN Claud Kelp, MD   4 mg at 12/26/14 1627  . phenol (CHLORASEPTIC) mouth spray 2 spray  2 spray Mouth/Throat PRN Karie Soda, MD      . promethazine (PHENERGAN) injection 6.25-12.5 mg  6.25-12.5 mg Intravenous Q4H  PRN Karie Soda, MD      . sodium chloride 0.9 % injection 3 mL  3 mL Intravenous Q12H Karie Soda, MD      . sodium chloride 0.9 % injection 3 mL  3 mL Intravenous PRN Karie Soda, MD      . sodium chloride 0.9 % injection 3 mL  3 mL Intravenous Q12H Karie Soda, MD      . sodium chloride 0.9 % injection 3 mL  3 mL Intravenous PRN Karie Soda, MD      . tamsulosin (FLOMAX) capsule 0.4 mg  0.4 mg Oral QPC breakfast Claud Kelp, MD   0.4 mg at 12/27/14 1003  . traMADol (ULTRAM) tablet 50-100 mg  50-100 mg Oral Q6H PRN Karie Soda, MD      . vitamin C (ASCORBIC ACID) tablet 500 mg  500 mg Oral BID Karie Soda, MD   500 mg at 12/27/14 2215     Allergies  Allergen Reactions  . Vicodin [Hydrocodone-Acetaminophen] Nausea And Vomiting  . Ciprofloxacin Hcl     rash  . Other Other (See Comments)    Pt is a Jehovah's Witness - Refusal of blood products - will accept albumin or albumin containing products     Disposition: 01-Home or Self Care  Discharge Instructions    Call MD for:  extreme fatigue    Complete by:  As directed      Call MD for:  hives    Complete by:  As directed      Call MD for:  persistant nausea and vomiting    Complete by:  As directed      Call MD for:  redness, tenderness, or signs of infection (pain, swelling, redness, odor or green/yellow discharge around incision site)    Complete by:  As directed      Call MD for:   severe uncontrolled pain    Complete by:  As directed      Call MD for:    Complete by:  As directed   Temperature > 101.89F     Diet - low sodium heart healthy    Complete by:  As directed      Discharge instructions    Complete by:  As directed   Please see discharge instruction sheets.  Also refer to handout given an office.  Please call our office if you have any questions or concerns [phone]     Discharge wound care:    Complete by:  As directed   If you have closed incisions, shower and bathe over these incisions with soap and water every day.  Remove all surgical dressings on postoperative day #3.  You do not need to replace dressings over the closed incisions unless you feel more comfortable with a Band-Aid covering it.   If you have an open wound that requires packing, please see wound care instructions.  In general, remove all dressings, wash wound with soap and water and then replace with saline moistened gauze.  Do the dressing change at least every day.  Please call our office [phone] if you have further questions.     Driving Restrictions    Complete by:  As directed   No driving until off narcotics and can safely swerve away without pain during an emergency     Increase activity slowly    Complete by:  As directed   Walk an hour a day.  Use 20-30 minute walks.  When you can walk 30 minutes  without difficulty, it is fine to restart low impact/moderate activities such as biking, jogging, swimming, sexual activity, etc.  Eventually you can increase to unrestricted activity when not feeling pain.  If you feel pain: STOP!Marland Kitchen   Let pain protect you from overdoing it.  Use ice/heat & over-the-counter pain medications to help minimize soreness.  If that is not enough, then use your narcotic pain prescription as needed to remain active.  It is better to take extra pain medications and be more active than to stay bedridden to avoid all pain medications.     Lifting restrictions     Complete by:  As directed   Avoid heavy lifting initially.  Do not push through pain.  You have no specific weight limit - if it hurts to do, DON'T DO IT.   If you feel no pain, you are not injuring anything.  Pain will protect you from injury.  Coughing and sneezing are far more stressful to your incision than any lifting.  Avoid resuming heavy lifting / intense activity until off all narcotic pain medications.  When ready to exercise more, give yourself 2 weeks to gradually get back to full intense exercise/activity.     May shower / Bathe    Complete by:  As directed      May walk up steps    Complete by:  As directed      Sexual Activity Restrictions    Complete by:  As directed   Sexual activity as tolerated.  Do not push through pain.  Pain will protect you from injury.     Walk with assistance    Complete by:  As directed   Walk over an hour a day.  May use a walker/cane/companion to help with balance and stamina.            Medication List    TAKE these medications        ibuprofen 800 MG tablet  Commonly known as:  ADVIL,MOTRIN  Take 1 tablet (800 mg total) by mouth 4 (four) times daily.     loratadine 10 MG tablet  Commonly known as:  CLARITIN  Take 10 mg by mouth daily as needed for allergies.     promethazine 12.5 MG tablet  Commonly known as:  PHENERGAN  Take 1-2 tablets (12.5-25 mg total) by mouth every 6 (six) hours as needed for nausea.     traMADol 50 MG tablet  Commonly known as:  ULTRAM  Take 1-2 tablets (50-100 mg total) by mouth every 6 (six) hours as needed for moderate pain or severe pain.     VITAMIN C PO  Take 1 tablet by mouth every morning.           Follow-up Information    Follow up with Ernestene Mention, MD. Schedule an appointment as soon as possible for a visit in 3 weeks.   Specialty:  General Surgery   Why:  To follow up after your operation, To follow up after your hospital stay   Contact information:   [address] [place] [address] [phone]        Signed: Lorenso Courier, M.D., F.A.C.S. Gastrointestinal and Minimally Invasive Surgery [facility] [address] [place], [place] [address] [phone] Main / Paging   12/28/2014, 9:46 AM

## 2015-03-13 ENCOUNTER — Emergency Department (HOSPITAL_COMMUNITY): Payer: BLUE CROSS/BLUE SHIELD

## 2015-03-13 ENCOUNTER — Encounter (HOSPITAL_COMMUNITY): Payer: Self-pay

## 2015-03-13 ENCOUNTER — Emergency Department (HOSPITAL_COMMUNITY)
Admission: EM | Admit: 2015-03-13 | Discharge: 2015-03-13 | Disposition: A | Discharge status: Home / Self Care | Payer: BLUE CROSS/BLUE SHIELD | Attending: Emergency Medicine | Admitting: Emergency Medicine

## 2015-03-13 DIAGNOSIS — Z791 Long term (current) use of non-steroidal anti-inflammatories (NSAID): Secondary | ICD-10-CM | POA: Insufficient documentation

## 2015-03-13 DIAGNOSIS — R938 Abnormal findings on diagnostic imaging of other specified body structures: Secondary | ICD-10-CM | POA: Insufficient documentation

## 2015-03-13 DIAGNOSIS — Z79899 Other long term (current) drug therapy: Secondary | ICD-10-CM | POA: Diagnosis not present

## 2015-03-13 DIAGNOSIS — Z8719 Personal history of other diseases of the digestive system: Secondary | ICD-10-CM | POA: Diagnosis not present

## 2015-03-13 DIAGNOSIS — R0789 Other chest pain: Secondary | ICD-10-CM | POA: Diagnosis not present

## 2015-03-13 DIAGNOSIS — R079 Chest pain, unspecified: Secondary | ICD-10-CM

## 2015-03-13 DIAGNOSIS — R9389 Abnormal findings on diagnostic imaging of other specified body structures: Secondary | ICD-10-CM

## 2015-03-13 LAB — CBC WITH DIFFERENTIAL/PLATELET
Basophils Absolute: 0 10*3/uL (ref 0.0–0.1)
Basophils Relative: 0 %
EOS ABS: 0.2 10*3/uL (ref 0.0–0.7)
Eosinophils Relative: 2 %
HCT: 40.8 % (ref 39.0–52.0)
Hemoglobin: 14.1 g/dL (ref 13.0–17.0)
LYMPHS ABS: 1.4 10*3/uL (ref 0.7–4.0)
Lymphocytes Relative: 15 %
MCH: 28.2 pg (ref 26.0–34.0)
MCHC: 34.6 g/dL (ref 30.0–36.0)
MCV: 81.6 fL (ref 78.0–100.0)
MONOS PCT: 8 %
Monocytes Absolute: 0.8 10*3/uL (ref 0.1–1.0)
NEUTROS ABS: 7 10*3/uL (ref 1.7–7.7)
Neutrophils Relative %: 75 %
PLATELETS: 222 10*3/uL (ref 150–400)
RBC: 5 MIL/uL (ref 4.22–5.81)
RDW: 12.8 % (ref 11.5–15.5)
WBC: 9.4 10*3/uL (ref 4.0–10.5)

## 2015-03-13 LAB — COMPREHENSIVE METABOLIC PANEL
ALBUMIN: 4 g/dL (ref 3.5–5.0)
ALT: 16 U/L — ABNORMAL LOW (ref 17–63)
AST: 19 U/L (ref 15–41)
Alkaline Phosphatase: 57 U/L (ref 38–126)
Anion gap: 7 (ref 5–15)
BUN: 10 mg/dL (ref 6–20)
CO2: 28 mmol/L (ref 22–32)
Calcium: 9 mg/dL (ref 8.9–10.3)
Chloride: 103 mmol/L (ref 101–111)
Creatinine, Ser: 1.09 mg/dL (ref 0.61–1.24)
GFR calc Af Amer: >60 mL/min (ref >60)
GFR calc non Af Amer: >60 mL/min (ref >60)
Glucose, Bld: 101 mg/dL — ABNORMAL HIGH (ref 65–99)
POTASSIUM: 4 mmol/L (ref 3.5–5.1)
Sodium: 138 mmol/L (ref 135–145)
TOTAL PROTEIN: 6.3 g/dL — AB (ref 6.5–8.1)
Total Bilirubin: 0.7 mg/dL (ref 0.3–1.2)

## 2015-03-13 LAB — D-DIMER, QUANTITATIVE (NOT AT ARMC): D DIMER QUANT: 6.27 ug{FEU}/mL — AB (ref 0.00–0.48)

## 2015-03-13 LAB — TROPONIN I: Troponin I: <0.03 ng/mL (ref <0.031)

## 2015-03-13 LAB — LIPASE, BLOOD: Lipase: 24 U/L (ref 22–51)

## 2015-03-13 MED ORDER — SODIUM CHLORIDE 0.9 % IV SOLN
INTRAVENOUS | Status: DC
  Administered 2015-03-13: 13:00:00 via INTRAVENOUS

## 2015-03-13 MED ORDER — IOHEXOL 350 MG/ML SOLN
100.0000 mL | Freq: Once | INTRAVENOUS | Status: AC | PRN
  Administered 2015-03-13: 100 mL via INTRAVENOUS

## 2015-03-13 NOTE — ED Provider Notes (Signed)
CSN: [id number]     Arrival date & time 03/13/15  1229 History   First MD Initiated Contact with Patient 03/13/15 1255     Chief Complaint  Patient presents with  . Chest Pain     (Consider location/radiation/quality/duration/timing/severity/associated sxs/prior Treatment) HPI Comments: Patient here complaining of chest tightness and pressure that is worse with bending over. Denies any exertional component to this. No associated diaphoresis or dyspnea. Denies any vomiting. Went to urgent care center and had EKG which showed nonspecific ST changes in patient sent here for further workup. He denies any leg pain or swelling. EMS was called and patient was given nitroglycerin which did help the symptoms. No fever or chills., However has had URI symptoms  Patient is a 49 y.o. male presenting with chest pain. The history is provided by the patient and the spouse.  Chest Pain   Past Medical History  Diagnosis Date  . Family history of adverse reaction to anesthesia     pts mother has difficulty awakening after anethesia   . Urinary hesitancy     difficulty with urination following anesthesia   . Bile leak, postoperative 2011    s/p lap chole - ERCP w stent 2011.  [place],  [place]  . BPH (benign prostatic hyperplasia) 12/28/2014   Past Surgical History  Procedure Laterality Date  . Cholecystectomy  03/04/2010    Dr Loreta Ave, [facility].  . Ercp w/ metal stent placement  2011  . Incisional hernia repair N/A 12/24/2014    Procedure: LAPAROSCOPIC POSSIBLE OPEN  INCISIONAL HERNIA REPAIR;  Surgeon: Claud Kelp, MD;  Location: [facility];  Service: General;  Laterality: N/A;  . Insertion of mesh N/A 12/24/2014    Procedure: INSERTION OF MESH;  Surgeon: Claud Kelp, MD;  Location: [facility];  Service: General;  Laterality: N/A;   History reviewed. No pertinent family history. Social History  Substance Use Topics  . Smoking status: Never Smoker   . Smokeless tobacco: Never Used  .  Alcohol Use: Yes     Comment: glass of wine occas    Review of Systems  Cardiovascular: Positive for chest pain.  All other systems reviewed and are negative.     Allergies  Vicodin; Ciprofloxacin hcl; and Other  Home Medications   Prior to Admission medications   Medication Sig Start Date End Date Taking? Authorizing Provider  Ascorbic Acid (VITAMIN C PO) Take 1 tablet by mouth every morning.    Historical Provider, MD  ibuprofen (ADVIL,MOTRIN) 800 MG tablet Take 1 tablet (800 mg total) by mouth 4 (four) times daily. 12/28/14   Romie Levee, MD  loratadine (CLARITIN) 10 MG tablet Take 10 mg by mouth daily as needed for allergies.    Historical Provider, MD  promethazine (PHENERGAN) 12.5 MG tablet Take 1-2 tablets (12.5-25 mg total) by mouth every 6 (six) hours as needed for nausea. 12/28/14   Romie Levee, MD  tamsulosin (FLOMAX) 0.4 MG CAPS capsule Take 1 capsule (0.4 mg total) by mouth daily after breakfast. 12/28/14   Romie Levee, MD  traMADol (ULTRAM) 50 MG tablet Take 1-2 tablets (50-100 mg total) by mouth every 6 (six) hours as needed for moderate pain or severe pain. 12/28/14   Karie Soda, MD   BP 111/65 mmHg  Pulse 81  Temp(Src) 98.3 F (36.8 C) (Oral)  Resp 10  Ht  (1.803 m)  Wt 183 lb (83.008 kg)  BMI 25.53 kg/m2  SpO2 98% Physical Exam  Constitutional: He is oriented to  person, place, and time. He appears well-developed and well-nourished.  Non-toxic appearance. No distress.  HENT:  Head: Normocephalic and atraumatic.  Eyes: Conjunctivae, EOM and lids are normal. Pupils are equal, round, and reactive to light.  Neck: Normal range of motion. Neck supple. No tracheal deviation present. No thyroid mass present.  Cardiovascular: Normal rate, regular rhythm and normal heart sounds.  Exam reveals no gallop.   No murmur heard. Pulmonary/Chest: Effort normal and breath sounds normal. No stridor. No respiratory distress. He has no decreased breath sounds. He has  no wheezes. He has no rhonchi. He has no rales.  Abdominal: Soft. Normal appearance and bowel sounds are normal. He exhibits no distension. There is no tenderness. There is no rebound and no CVA tenderness.  Musculoskeletal: Normal range of motion. He exhibits no edema or tenderness.  Neurological: He is alert and oriented to person, place, and time. He has normal strength. No cranial nerve deficit or sensory deficit. GCS eye subscore is 4. GCS verbal subscore is 5. GCS motor subscore is 6.  Skin: Skin is warm and dry. No abrasion and no rash noted.  Psychiatric: He has a normal mood and affect. His speech is normal and behavior is normal.  Nursing note and vitals reviewed.   ED Course  Procedures (including critical care time) Labs Review Labs Reviewed  CBC WITH DIFFERENTIAL/PLATELET  COMPREHENSIVE METABOLIC PANEL  LIPASE, BLOOD  TROPONIN I  D-DIMER, QUANTITATIVE (NOT AT [facility])    Imaging Review No results found. I have personally reviewed and evaluated these images and lab results as part of my medical decision-making.   EKG Interpretation   Date/Time:  Friday March 13 2015 12:34:44 EDT Ventricular Rate:  77 PR Interval:  142 QRS Duration: 84 QT Interval:  370 QTC Calculation: 419 R Axis:   14 Text Interpretation:  Sinus rhythm Borderline repolarization abnormality  Baseline wander in lead(s) I Confirmed by ALLEN  MD, ANTHONY ([id number]) on  03/13/2015 1:12:30 PM      MDM   Final diagnoses:  Chest pain   Patient's symptoms are not consistent with ACS. Does have elevated d-dimer and patient had hernia surgery done 8 weeks ago and was sedentary. He denies any leg cramps time. Chest CT is pending at this time and signed out to Dr. Earlie Lou, MD 03/13/15 [phone]

## 2015-03-13 NOTE — ED Notes (Signed)
Patient's Wife reports on Sunday, "Patient was outside and complained of blurred vision on the left side with headache and neck tension. Lasted for about thirty minutes and resolved."

## 2015-03-13 NOTE — Discharge Instructions (Signed)
The CT of the chest showed lymphadenopathy, which will require further evaluation, by your primary care doctor. This should be performed within 1 month's time.    Chest Pain (Nonspecific) It is often hard to give a specific diagnosis for the cause of chest pain. There is always a chance that your pain could be related to something serious, such as a heart attack or a blood clot in the lungs. You need to follow up with your health care provider for further evaluation. CAUSES   Heartburn.  Pneumonia or bronchitis.  Anxiety or stress.  Inflammation around your heart (pericarditis) or lung (pleuritis or pleurisy).  A blood clot in the lung.  A collapsed lung (pneumothorax). It can develop suddenly on its own (spontaneous pneumothorax) or from trauma to the chest.  Shingles infection (herpes zoster virus). The chest wall is composed of bones, muscles, and cartilage. Any of these can be the source of the pain.  The bones can be bruised by injury.  The muscles or cartilage can be strained by coughing or overwork.  The cartilage can be affected by inflammation and become sore (costochondritis). DIAGNOSIS  Lab tests or other studies may be needed to find the cause of your pain. Your health care provider may have you take a test called an ambulatory electrocardiogram (ECG). An ECG records your heartbeat patterns over a 24-hour period. You may also have other tests, such as:  Transthoracic echocardiogram (TTE). During echocardiography, sound waves are used to evaluate how blood flows through your heart.  Transesophageal echocardiogram (TEE).  Cardiac monitoring. This allows your health care provider to monitor your heart rate and rhythm in real time.  Holter monitor. This is a portable device that records your heartbeat and can help diagnose heart arrhythmias. It allows your health care provider to track your heart activity for several days, if needed.  Stress tests by exercise or by  giving medicine that makes the heart beat faster. TREATMENT   Treatment depends on what may be causing your chest pain. Treatment may include:  Acid blockers for heartburn.  Anti-inflammatory medicine.  Pain medicine for inflammatory conditions.  Antibiotics if an infection is present.  You may be advised to change lifestyle habits. This includes stopping smoking and avoiding alcohol, caffeine, and chocolate.  You may be advised to keep your head raised (elevated) when sleeping. This reduces the chance of acid going backward from your stomach into your esophagus. Most of the time, nonspecific chest pain will improve within 2-3 days with rest and mild pain medicine.  HOME CARE INSTRUCTIONS   If antibiotics were prescribed, take them as directed. Finish them even if you start to feel better.  For the next few days, avoid physical activities that bring on chest pain. Continue physical activities as directed.  Do not use any tobacco products, including cigarettes, chewing tobacco, or electronic cigarettes.  Avoid drinking alcohol.  Only take medicine as directed by your health care provider.  Follow your health care provider's suggestions for further testing if your chest pain does not go away.  Keep any follow-up appointments you made. If you do not go to an appointment, you could develop lasting (chronic) problems with pain. If there is any problem keeping an appointment, call to reschedule. SEEK MEDICAL CARE IF:   Your chest pain does not go away, even after treatment.  You have a rash with blisters on your chest.  You have a fever. SEEK IMMEDIATE MEDICAL CARE IF:   You have increased chest  pain or pain that spreads to your arm, neck, jaw, back, or abdomen.  You have shortness of breath.  You have an increasing cough, or you cough up blood.  You have severe back or abdominal pain.  You feel nauseous or vomit.  You have severe weakness.  You faint.  You have  chills. This is an emergency. Do not wait to see if the pain will go away. Get medical help at once. Call your local emergency services (911 in [place] Do not drive yourself to the hospital. MAKE SURE YOU:   Understand these instructions.  Will watch your condition.  Will get help right away if you are not doing well or get worse. Document Released: 03/16/2005 Document Revised: 06/11/2013 Document Reviewed: 01/10/2008 [facility] Patient Information 2015 [place], [place]. This information is not intended to replace advice given to you by your health care provider. Make sure you discuss any questions you have with your health care provider.  Incidental Abnormal Radiological Finding An incidental abnormal radiologic finding is a very small mass or scar tissue, detected anywhere in the body, unrelated to the reason for your visit. With newer imaging tests and technologies, it is becoming more common to detect small masses and tissue abnormalities. It is important for you to work with your caregiver because it can sometimes be related to undiagnosed illness or other symptoms. Most often however, the finding is not causing symptoms and is not a cause for concern. TYPES OF FINDINGS Abnormal radiologic findings are often located in the kidneys or lungs, but they can also be found in the heart, liver, breasts, brain, gallbladder, uterus and other surrounding organs and tissues. There are many types of masses and tissue abnormalities that can be detected during an imaging test. These may include:  Lesions - changes in tissue due to infection, tissue death, or trauma.  Cysts - a sac filled with fluid, crystals, or some other substance.  Tumors - non-cancerous or cancerous solid formation. You may hear medical terms, such as, "pulmonary nodule" (a small mass in the lung) or "renal mass" (mass in the kidney). Ask your caregiver if these terms apply to your findings.  DO I NEED FURTHER DIAGNOSIS? There are  many possible causes of incidental radiologic findings. Your caregiver will determine whether it requires additional screening tests, diagnostic tests, treatments, or referral to a surgeon.Generally, very small tissue changes or masses will not require any follow up testing. Much research has been done in this area.These very small abnormalities are considered low risk of becoming a problem in the future.  Depending on the size and appearance of the finding, your caregiver may recommend additional testing.Additional testing may also be recommended if you have certain risk factors or medical conditions that increase your risk of related problems. It is a good idea to have additional testing if you have other symptoms or concerns. Sometimes, these early findings can give you a chance at early treatment and avoid problems in the future. TESTING AND DIAGNOSIS Tests and exams may be a one-time screening or periodic follow-up. Periodic follow-up will help your caregiver determine whether the abnormality is growing and becoming a concern. Tests may include:  Physical examination.  Blood tests.  Urine tests.  Imaging tests, such as abdominal ultrasound, CT scan, or MRI.  Biopsy. TREATMENT Treatment varies, depending on the cause, location, size and appearance of the finding. Treatment will also depend on your age and underlying conditions or symptoms. Sometimes treatment is not necessary at all. However, treatment  may include:  Watchful waiting with periodic examination and testing.  Treatments to reduce the size of the abnormality.  Surgical or biopsy removal of the abnormality.  Additional treatments to address any underlying conditions. HOME CARE INSTRUCTIONS   See your caregiver for follow up examination and testing as directed. It is important that you schedule appointments as directed.  Keep calm. Your caregiver will let you know if this is a routine follow up, or if there is a reason  for concern. Remember, early detection can be very beneficial to you.  Follow all of your caregiver's aftercare instructions related to the reason for your visit. Document Released: 09/21/2010 Document Revised: 08/29/2011 Document Reviewed: 09/21/2010 [facility] Patient Information 2015 [place], [place]. This information is not intended to replace advice given to you by your health care provider. Make sure you discuss any questions you have with your health care provider.

## 2015-03-13 NOTE — ED Notes (Signed)
Per EMS, Pt was putting on his shoes four days ago when he started to have chest pain that has been constant since then. Patient reports a 2/10 pressure Chest Pain that does not radiate. Patient denies any nausea, vomiting, Dizziness, SOB, HA. Pt went to Nurse at work and they performed a 12-Lead and gave 324 mg of Aspirin. During transport, patient was given one Nitro and pain decreased to 0/10. Vitals per EMS: 128/77, 86 HR, 98% on RA.

## 2015-03-13 NOTE — ED Notes (Signed)
Patient returned from [facility]. 

## 2015-03-13 NOTE — ED Provider Notes (Signed)
17:05- patient evaluated after return of CT imaging, to be evaluated for PE. Patient had chest discomfort, and has been evaluated for cardiac) problems today. The CT is reassuring for absence of PE. I reviewed his earlier workup which is not concerning for ACS. I discussed the case with Dr. Freida Busman, who felt that he was stable for discharge, if the CT did not show an acute PE. These findings were discussed with the patient and his wife. The patient is concerned about the diagnosis of adenopathy, unspecified, and he is concerned that he may have cancer. I explained to him that the finding on the CT is, incidental, and should be evaluated as an outpatient within one month. He states that he has a PCP that he can see to arrange for this evaluation. The patient and his wife requested. Images to be put on an optical disc, which we arranged.  Dg Chest 2 View  03/13/2015   CLINICAL DATA:  Upper CP x 4 days, blurred vision in left eye x Sunday for 20-30 min then went away, Slight headache x Sunday, then went away, nonsmoker, pt shielded  EXAM: CHEST  2 VIEW  COMPARISON:  05/18/2009  FINDINGS: The heart size and mediastinal contours are within normal limits. Both lungs are clear. The visualized skeletal structures are unremarkable.  IMPRESSION: No active cardiopulmonary disease.   Electronically Signed   By: Norva Pavlov M.D.   On: 03/13/2015 13:33   Ct Angio Chest Pe W/cm &/or Wo Cm  03/13/2015   CLINICAL DATA:  Chest pain  EXAM: CT ANGIOGRAPHY CHEST WITH CONTRAST  TECHNIQUE: Multidetector CT imaging of the chest was performed using the standard protocol during bolus administration of intravenous contrast. Multiplanar CT image reconstructions and MIPs were obtained to evaluate the vascular anatomy.  CONTRAST:  OMNIPAQUE IOHEXOL 350 MG/ML SOLN  COMPARISON:  None.  FINDINGS: There are no filling defects in the pulmonary arterial tree to suggest acute pulmonary thromboembolism.  Abnormal mediastinal adenopathy.  15 mm paratracheal node on image 16. 18 mm pretracheal node on image 22. 15 mm precarinal node on image 32. 20 mm subcarinal node on image 39.  Hilar adenopathy. 11 mm left hilar node on image 41. 9 mm right hilar node on image 46.  No pneumothorax.  No pleural effusion.  Lungs clear.  Partially imaged soft tissue masses in the retroperitoneum of the upper abdomen are present on images 84-92. These are worrisome for abnormally enlarged lymph nodes. 14 mm soft tissue mass worrisome for adenopathy on image 92. Other smaller nodes are also suspected.  No acute bony deformity.  Review of the MIP images confirms the above findings.  IMPRESSION: No evidence of acute pulmonary thromboembolism.  Abnormal mediastinal, hilar, and upper abdominal adenopathy. Differential diagnosis includes malignancy such as lymphoma and sarcoidosis. PET-CT may be helpful for further characterization.   Electronically Signed   By: Jolaine Click M.D.   On: 03/13/2015 16:17    Mancel Bale, MD 03/13/15 [phone]

## 2015-09-19 DEATH — deceased

## 2016-07-26 IMAGING — CR DG CHEST 2V
2 series · 2 of 2 positions shown · non-contrast
Comparison: 05/18/2009

CLINICAL DATA: Upper CP x 4 days, blurred vision in left eye x
[REDACTED] for 20-30 min then went away, Slight headache x [REDACTED], then
went away, nonsmoker, pt shielded

EXAM:
CHEST  2 VIEW

[chest pa]
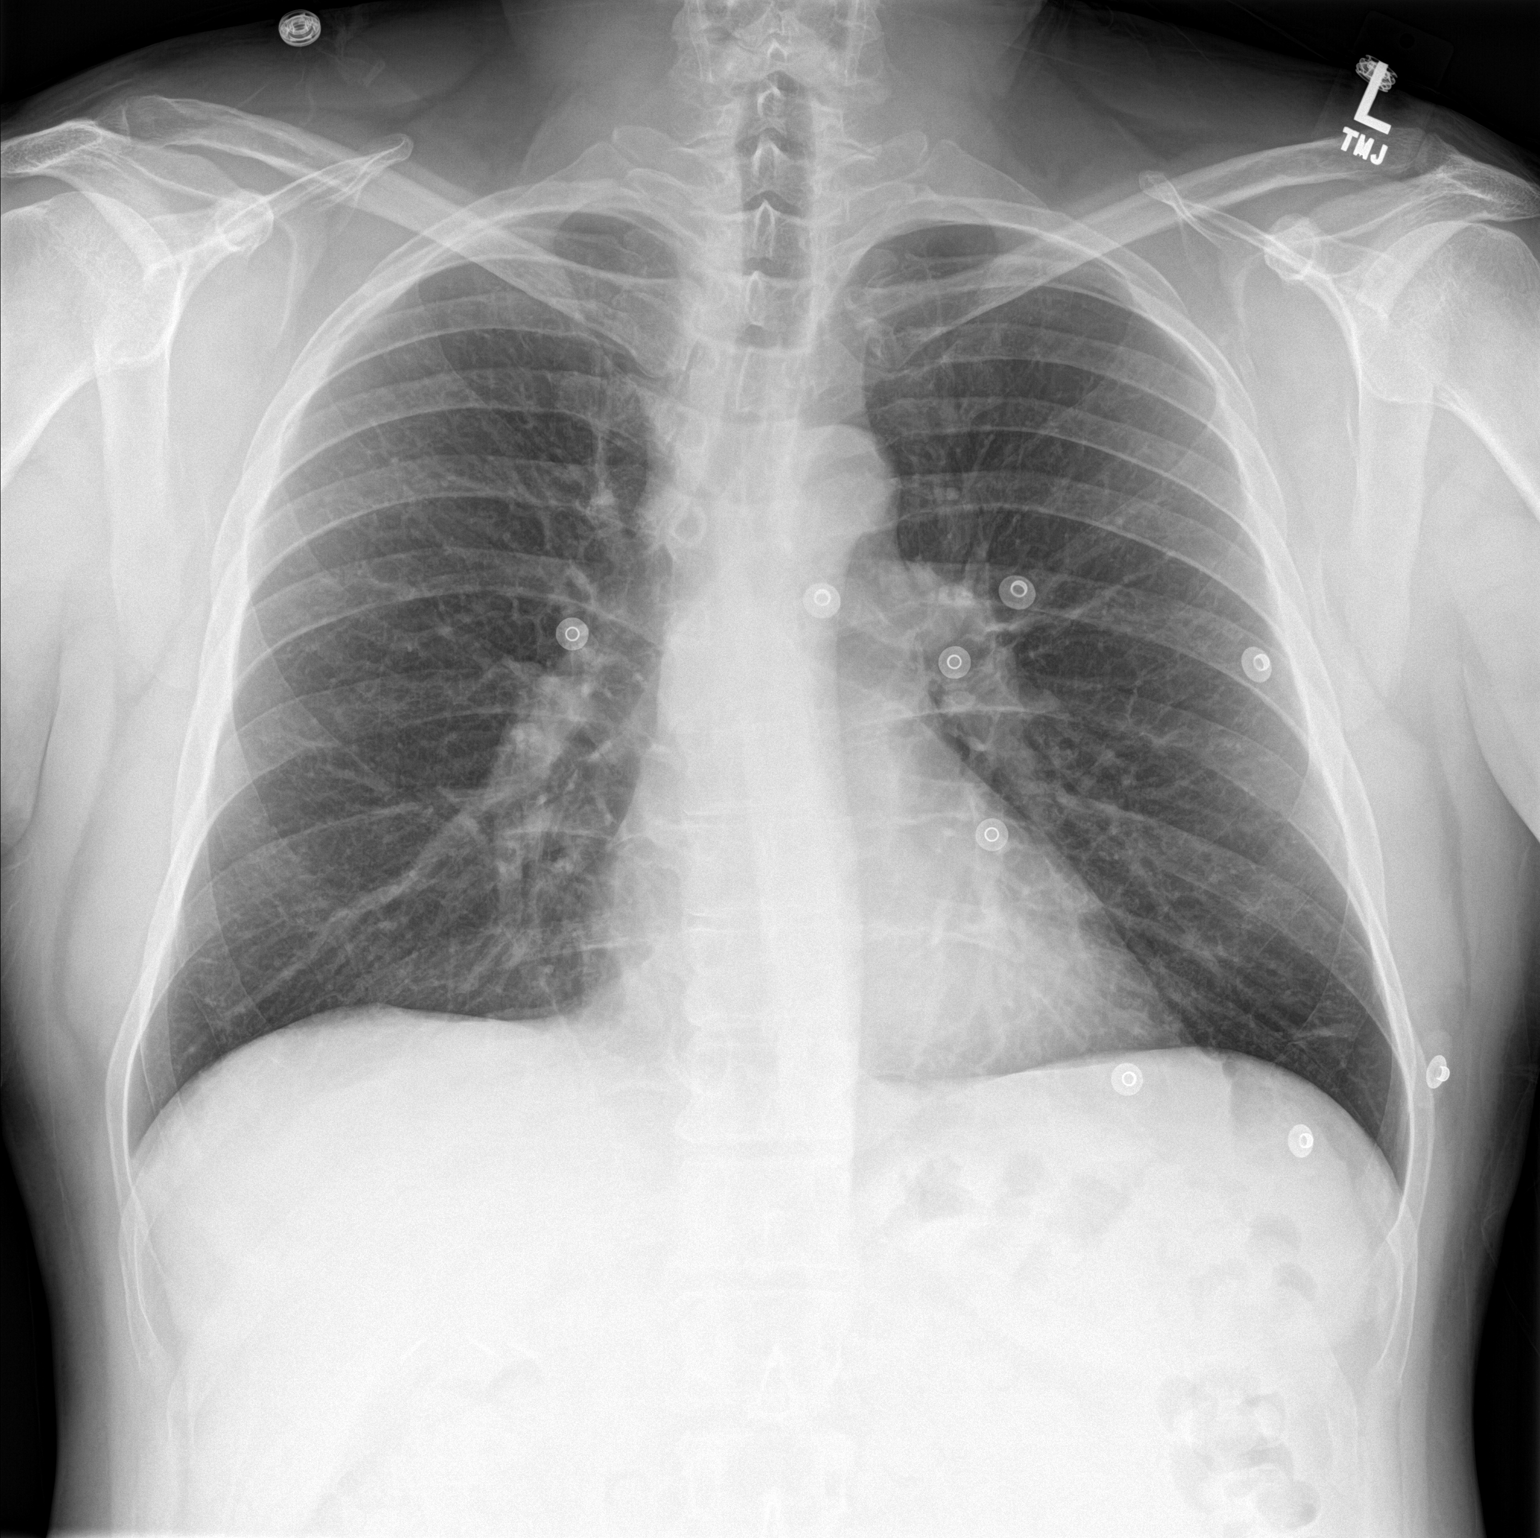

[chest lat]
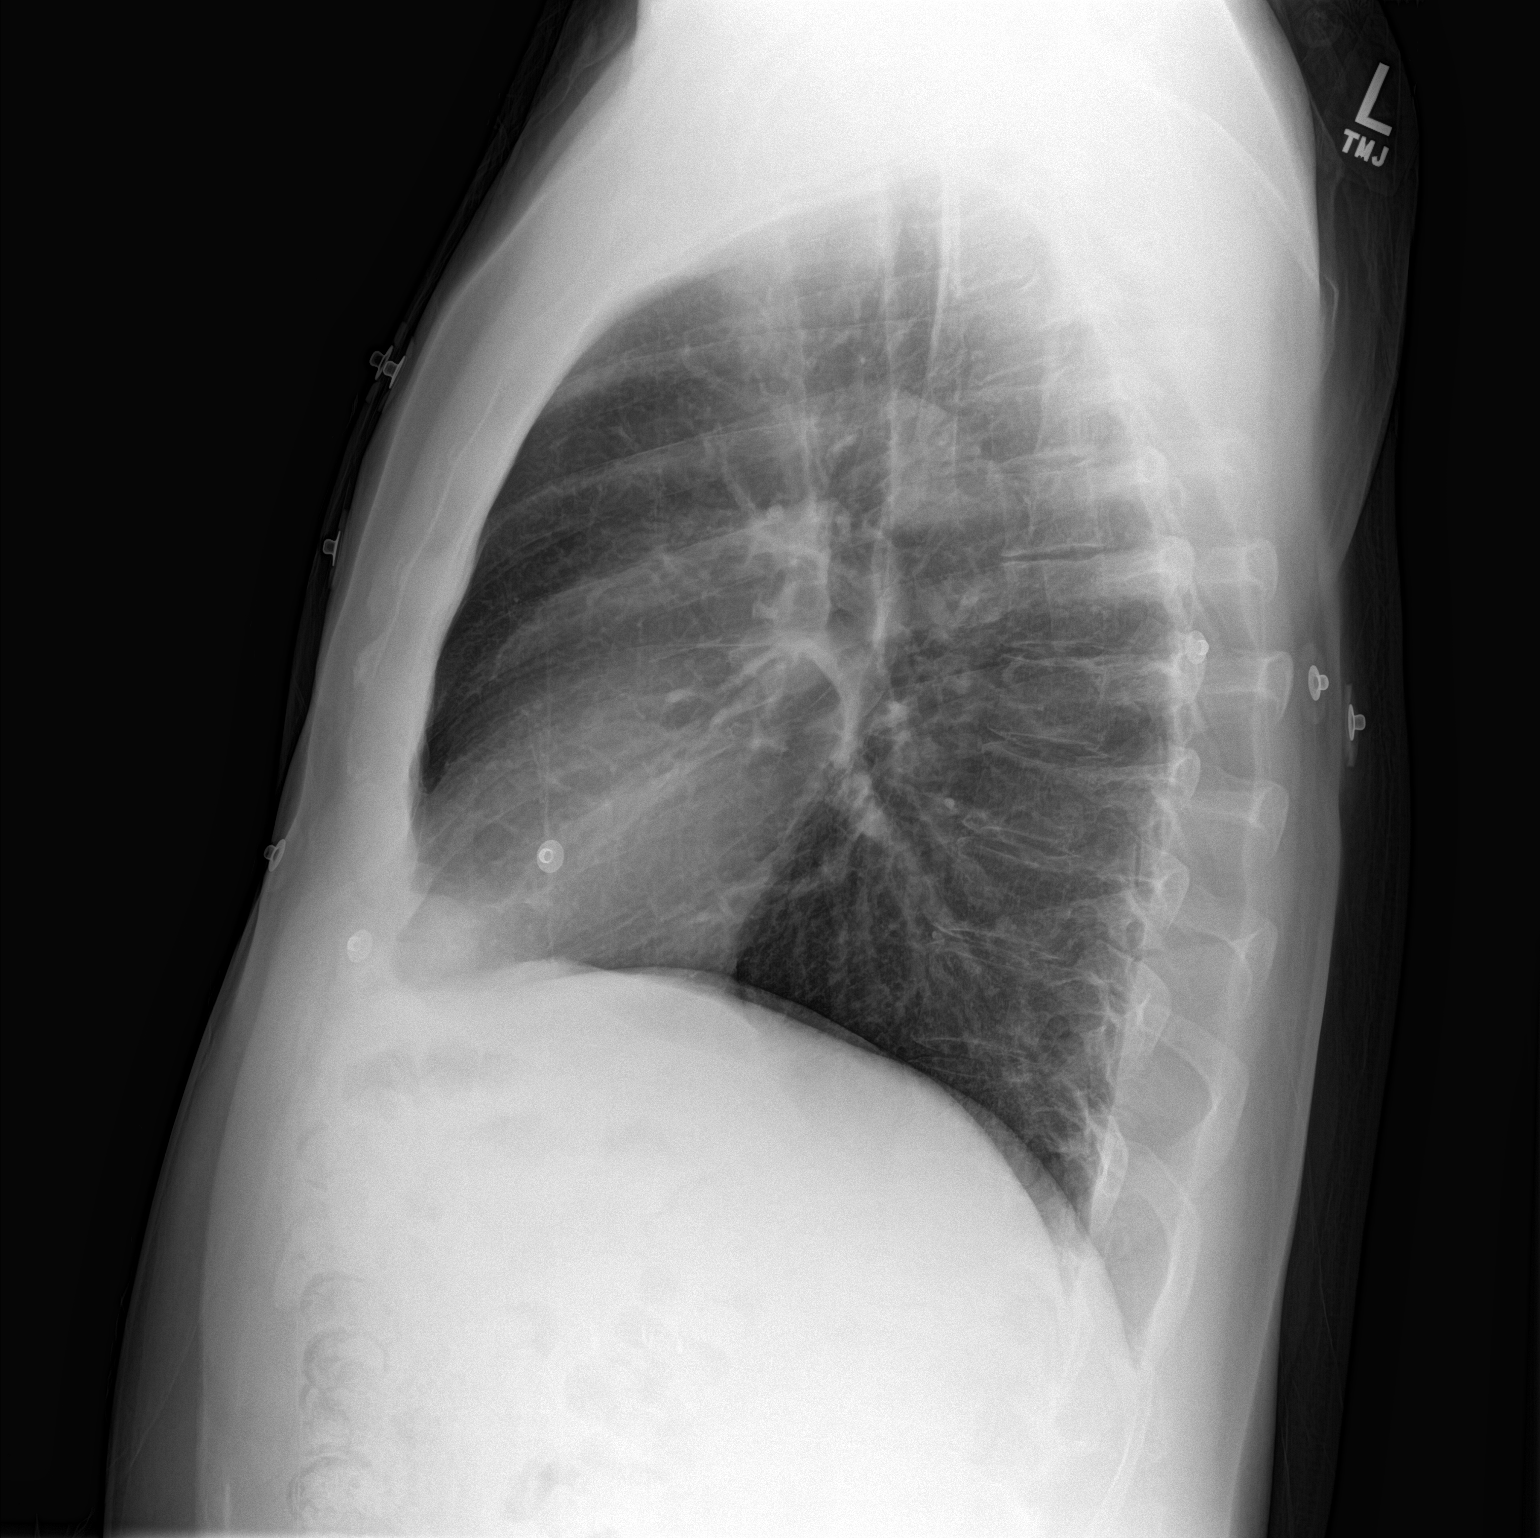

[2 of 2 positions shown; findings below may reference images not displayed]

FINDINGS: The heart size and mediastinal contours are within normal limits.
Both lungs are clear. The visualized skeletal structures are
unremarkable.
IMPRESSION: No active cardiopulmonary disease.

## 2021-03-22 ENCOUNTER — Encounter: Payer: Self-pay | Admitting: Internal Medicine
